# Patient Record
Sex: Male | Born: 1955 | Race: White | Hispanic: No | Marital: Married | Smoking: Former smoker
Health system: Southern US, Community
[De-identification: ages and names within clinical notes are randomized; demographics above are authoritative.]

## PROBLEM LIST (undated history)

## (undated) DIAGNOSIS — E119 Type 2 diabetes mellitus without complications: Secondary | ICD-10-CM

## (undated) DIAGNOSIS — I10 Essential (primary) hypertension: Secondary | ICD-10-CM

## (undated) DIAGNOSIS — I2510 Atherosclerotic heart disease of native coronary artery without angina pectoris: Secondary | ICD-10-CM

## (undated) DIAGNOSIS — Z87442 Personal history of urinary calculi: Secondary | ICD-10-CM

## (undated) DIAGNOSIS — N289 Disorder of kidney and ureter, unspecified: Secondary | ICD-10-CM

## (undated) DIAGNOSIS — M1990 Unspecified osteoarthritis, unspecified site: Secondary | ICD-10-CM

## (undated) HISTORY — PX: LITHOTRIPSY: SUR834

## (undated) HISTORY — PX: CORONARY ANGIOPLASTY WITH STENT PLACEMENT: SHX49

---

## 1999-03-02 ENCOUNTER — Encounter: Payer: Self-pay | Admitting: Urology

## 1999-03-02 ENCOUNTER — Inpatient Hospital Stay (HOSPITAL_COMMUNITY): Admission: RE | Admit: 1999-03-02 | Discharge: 1999-03-04 | Payer: Self-pay | Admitting: Urology

## 1999-03-03 ENCOUNTER — Encounter: Payer: Self-pay | Admitting: Urology

## 2003-07-06 ENCOUNTER — Encounter: Admission: RE | Admit: 2003-07-06 | Discharge: 2003-10-04 | Payer: Self-pay | Admitting: Family Medicine

## 2003-11-02 ENCOUNTER — Encounter: Admission: RE | Admit: 2003-11-02 | Discharge: 2004-01-31 | Payer: Self-pay | Admitting: Family Medicine

## 2008-08-22 DIAGNOSIS — I219 Acute myocardial infarction, unspecified: Secondary | ICD-10-CM

## 2008-08-22 HISTORY — DX: Acute myocardial infarction, unspecified: I21.9

## 2008-08-23 ENCOUNTER — Inpatient Hospital Stay (HOSPITAL_COMMUNITY): Admission: AD | Admit: 2008-08-23 | Discharge: 2008-08-27 | Payer: Self-pay | Admitting: Cardiology

## 2008-08-23 ENCOUNTER — Encounter: Payer: Self-pay | Admitting: Emergency Medicine

## 2008-08-23 ENCOUNTER — Ambulatory Visit: Payer: Self-pay | Admitting: Radiology

## 2008-08-23 ENCOUNTER — Ambulatory Visit: Payer: Self-pay | Admitting: Cardiology

## 2008-08-24 ENCOUNTER — Encounter: Payer: Self-pay | Admitting: Cardiology

## 2008-08-24 ENCOUNTER — Encounter: Admission: RE | Admit: 2008-08-24 | Discharge: 2008-08-26 | Payer: Self-pay | Admitting: Cardiology

## 2008-09-16 ENCOUNTER — Encounter (HOSPITAL_COMMUNITY): Admission: RE | Admit: 2008-09-16 | Discharge: 2008-12-15 | Payer: Self-pay | Admitting: Cardiology

## 2008-09-20 ENCOUNTER — Encounter: Payer: Self-pay | Admitting: Cardiology

## 2008-09-20 ENCOUNTER — Ambulatory Visit: Payer: Self-pay | Admitting: Cardiology

## 2008-10-19 ENCOUNTER — Telehealth: Payer: Self-pay | Admitting: Cardiology

## 2008-11-11 ENCOUNTER — Encounter: Payer: Self-pay | Admitting: Cardiology

## 2008-11-24 ENCOUNTER — Encounter (INDEPENDENT_AMBULATORY_CARE_PROVIDER_SITE_OTHER): Payer: Self-pay | Admitting: *Deleted

## 2008-12-17 ENCOUNTER — Ambulatory Visit: Payer: Self-pay | Admitting: Cardiology

## 2008-12-20 ENCOUNTER — Ambulatory Visit: Payer: Self-pay | Admitting: Cardiology

## 2009-01-11 LAB — CONVERTED CEMR LAB
ALT: 18 units/L (ref 0–53)
AST: 17 units/L (ref 0–37)
Albumin: 3.9 g/dL (ref 3.5–5.2)
Alkaline Phosphatase: 60 units/L (ref 39–117)
Bilirubin, Direct: 0.1 mg/dL (ref 0.0–0.3)
Cholesterol: 133 mg/dL (ref 0–200)
HDL: 38.7 mg/dL — ABNORMAL LOW (ref >39.00)
LDL Cholesterol: 67 mg/dL (ref 0–99)
Total Bilirubin: 0.7 mg/dL (ref 0.3–1.2)
Total CHOL/HDL Ratio: 3
Total Protein: 7 g/dL (ref 6.0–8.3)
Triglycerides: 138 mg/dL (ref 0.0–149.0)
VLDL: 27.6 mg/dL (ref 0.0–40.0)

## 2009-01-12 ENCOUNTER — Encounter: Payer: Self-pay | Admitting: Cardiology

## 2009-02-03 ENCOUNTER — Encounter: Payer: Self-pay | Admitting: Cardiology

## 2009-04-15 ENCOUNTER — Encounter: Payer: Self-pay | Admitting: Cardiology

## 2009-04-15 ENCOUNTER — Ambulatory Visit: Payer: Self-pay | Admitting: Cardiology

## 2009-04-18 ENCOUNTER — Encounter (INDEPENDENT_AMBULATORY_CARE_PROVIDER_SITE_OTHER): Payer: Self-pay | Admitting: *Deleted

## 2009-04-18 DIAGNOSIS — E119 Type 2 diabetes mellitus without complications: Secondary | ICD-10-CM | POA: Insufficient documentation

## 2009-04-18 DIAGNOSIS — R079 Chest pain, unspecified: Secondary | ICD-10-CM

## 2009-04-18 DIAGNOSIS — I10 Essential (primary) hypertension: Secondary | ICD-10-CM | POA: Insufficient documentation

## 2009-04-18 DIAGNOSIS — E785 Hyperlipidemia, unspecified: Secondary | ICD-10-CM | POA: Insufficient documentation

## 2009-04-19 ENCOUNTER — Ambulatory Visit: Payer: Self-pay | Admitting: Cardiology

## 2009-06-10 ENCOUNTER — Encounter (INDEPENDENT_AMBULATORY_CARE_PROVIDER_SITE_OTHER): Payer: Self-pay | Admitting: *Deleted

## 2009-08-02 DIAGNOSIS — I214 Non-ST elevation (NSTEMI) myocardial infarction: Secondary | ICD-10-CM | POA: Insufficient documentation

## 2009-08-02 DIAGNOSIS — I2510 Atherosclerotic heart disease of native coronary artery without angina pectoris: Secondary | ICD-10-CM | POA: Insufficient documentation

## 2009-08-16 ENCOUNTER — Ambulatory Visit: Payer: Self-pay | Admitting: Cardiology

## 2009-08-25 ENCOUNTER — Telehealth (INDEPENDENT_AMBULATORY_CARE_PROVIDER_SITE_OTHER): Payer: Self-pay | Admitting: *Deleted

## 2009-08-29 ENCOUNTER — Ambulatory Visit: Payer: Self-pay | Admitting: Cardiovascular Disease

## 2009-08-29 ENCOUNTER — Ambulatory Visit: Payer: Self-pay

## 2009-08-29 ENCOUNTER — Encounter (HOSPITAL_COMMUNITY): Admission: RE | Admit: 2009-08-29 | Discharge: 2009-11-08 | Payer: Self-pay | Admitting: Cardiology

## 2009-10-04 ENCOUNTER — Encounter (INDEPENDENT_AMBULATORY_CARE_PROVIDER_SITE_OTHER): Payer: Self-pay | Admitting: *Deleted

## 2010-01-24 ENCOUNTER — Ambulatory Visit: Payer: Self-pay | Admitting: Internal Medicine

## 2010-01-25 ENCOUNTER — Observation Stay (HOSPITAL_COMMUNITY): Admission: EM | Admit: 2010-01-25 | Discharge: 2010-01-26 | Payer: Self-pay | Admitting: Emergency Medicine

## 2010-02-16 ENCOUNTER — Encounter: Payer: Self-pay | Admitting: Physician Assistant

## 2010-02-21 ENCOUNTER — Telehealth (INDEPENDENT_AMBULATORY_CARE_PROVIDER_SITE_OTHER): Payer: Self-pay | Admitting: *Deleted

## 2010-02-21 ENCOUNTER — Ambulatory Visit: Payer: Self-pay | Admitting: Cardiology

## 2010-02-21 ENCOUNTER — Encounter: Payer: Self-pay | Admitting: Physician Assistant

## 2010-02-21 DIAGNOSIS — F411 Generalized anxiety disorder: Secondary | ICD-10-CM | POA: Insufficient documentation

## 2010-03-30 ENCOUNTER — Telehealth (INDEPENDENT_AMBULATORY_CARE_PROVIDER_SITE_OTHER): Payer: Self-pay | Admitting: *Deleted

## 2010-04-19 ENCOUNTER — Ambulatory Visit: Payer: Self-pay | Admitting: Cardiology

## 2010-05-31 ENCOUNTER — Ambulatory Visit: Payer: Self-pay | Admitting: Internal Medicine

## 2010-08-02 ENCOUNTER — Telehealth: Payer: Self-pay | Admitting: Cardiology

## 2010-08-08 ENCOUNTER — Other Ambulatory Visit: Payer: Self-pay | Admitting: Cardiology

## 2010-08-08 ENCOUNTER — Ambulatory Visit
Admission: RE | Admit: 2010-08-08 | Discharge: 2010-08-08 | Payer: Self-pay | Source: Home / Self Care | Attending: Cardiology | Admitting: Cardiology

## 2010-08-08 LAB — LIPID PANEL
HDL: 36.1 mg/dL — ABNORMAL LOW (ref >39.00)
LDL Cholesterol: 90 mg/dL (ref 0–99)
Total CHOL/HDL Ratio: 4
Triglycerides: 158 mg/dL — ABNORMAL HIGH (ref 0.0–149.0)

## 2010-08-08 LAB — HEPATIC FUNCTION PANEL
Albumin: 3.9 g/dL (ref 3.5–5.2)
Total Protein: 6.9 g/dL (ref 6.0–8.3)

## 2010-08-10 NOTE — Assessment & Plan Note (Signed)
Summary: Cardiology Nuclear Study  Nuclear Med Background Indications for Stress Test: Evaluation for Ischemia, Stent Patency   History: Heart Catheterization, Myocardial Infarction, Stents  History Comments: 2/10 MI: NSTEMI > Heart Cath: EF=65% > Stent: RCA  Symptoms: Chest Pain with Exertion, SOB    Nuclear Pre-Procedure Cardiac Risk Factors: Hypertension, Lipids, NIDDM Caffeine/Decaff Intake: None NPO After: 6:00 AM Lungs: clear IV 0.9% NS with Angio Cath: 22g     IV Site: (R) AC IV Started by: Irean Hong RN Chest Size (in) 46     Height (in): 69 Weight (lb): 225 BMI: 33.35 Tech Comments: The patient took the lopressor 12 hrs ago, baseline HR 59-60.The patient has a problem with (L) knee pain and is wearing a brace today.The patient prefers to do the test with medication today rather than rescheduling the ETT myoview. P. Edwards,RN.  Nuclear Med Study 1 or 2 day study:  1 day     Stress Test Type:  Eugenie Birks Reading MD:  Charlton Haws, MD     Referring MD:  T.Wall Resting Radionuclide:  Technetium 11m Tetrofosmin     Resting Radionuclide Dose:  11.0 mCi  Stress Radionuclide:  Technetium 30m Tetrofosmin     Stress Radionuclide Dose:  33.0 mCi   Stress Protocol   Lexiscan: 0.4 mg   Stress Test Technologist:  Milana Na EMT-P     Nuclear Technologist:  Burna Mortimer Deal RT-N  Rest Procedure  Myocardial perfusion imaging was performed at rest 45 minutes following the intravenous administration of Myoview Technetium 35m Tetrofosmin.  Stress Procedure  The patient received IV Lexiscan 0.4 mg over 15-seconds.  Myoview injected at 30-seconds.  There were no significant changes with infusion.  Quantitative spect images were obtained after a 45 minute delay.  QPS Raw Data Images:  Normal; no motion artifact; normal heart/lung ratio. Stress Images:  NI: Uniform and normal uptake of tracer in all myocardial segments. Rest Images:  Normal homogeneous uptake in all areas of the  myocardium. Subtraction (SDS):  SDS 9 abnormal in mid and basal septum but not seen visually Transient Ischemic Dilatation:  1.11  (Normal <1.22)  Lung/Heart Ratio:  .28  (Normal <0.45)  Quantitative Gated Spect Images QGS EDV:  119 ml QGS ESV:  51 ml QGS EF:  57 % QGS cine images:  normal  Findings Normal nuclear study      Overall Impression  Exercise Capacity: Lexiscan BP Response: Normal blood pressure response. Clinical Symptoms: Abdominal Cramping ECG Impression: No significant ST segment change suggestive of ischemia. Overall Impression: Normal stress nuclear study. Overall Impression Comments: Mild attenuation mid and basal inferior wall consistant with diaphragm  Appended Document: Cardiology Nuclear Study good. no change in treatment.  Appended Document: Cardiology Nuclear Study LMTCB./CY  Appended Document: Cardiology Nuclear Study pt aware ./cy

## 2010-08-10 NOTE — Progress Notes (Signed)
  [employer] request recieved sent to [facility]  February 21, 2010 8:27 AM     Appended Document:  [employer]UM request recieved sent to [facility]

## 2010-08-10 NOTE — Assessment & Plan Note (Signed)
Summary: 4 MONTH ROV/SL   Visit Type:  4 mo f/u Primary Provider:  Donovan Kail  CC:  no cardiac complaints today.  History of Present Illness: Ryan Stuart returns today for evaluation managed on artery disease, mixed hyperlipidemia, hypertension. He remains in the tracer study.  He's had some chest discomfort well localized at the left breast. 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 He's had no other exertional chest pain.  He's had a drug-eluting stent placed to his right coronary artery and overlapping drug-eluting stents to his LAD. He has normal left ventricular function. He presented with exertional angina and ruled in for a non-ST segment elevation MI. He is almost a year out from the event as of February 17.  He is not smoking. He is very compliant with his medications. He would like to come off some of his medications however.  Current Medications (verified): 1)  Metformin Hcl 500 Mg Tabs (Metformin Hcl) .Marland Kitchen.. 1 Tab Two Times A Day 2)  Aspirin Ec 325 Mg Tbec (Aspirin) .... Take One Tablet By Mouth Daily 3)  Fish Oil 1000 Mg Caps (Omega-3 Fatty Acids) .... 2 Caps Two Times A Day 4)  Plavix 75 Mg Tabs (Clopidogrel Bisulfate) .Marland Kitchen.. 1 Tab Once Daily 5)  Amlodipine Besy-Benazepril Hcl 5-20 Mg Caps (Amlodipine Besy-Benazepril Hcl) .Marland Kitchen.. 1 Tab Once Daily 6)  Lopressor 50 Mg Tabs (Metoprolol Tartrate) .Marland Kitchen.. 1 Tab Two Times A Day 7)  Simvastatin 40 Mg Tabs (Simvastatin) .Marland Kitchen.. 1 Tab At Bedtime 8)  Isosorbide Mononitrate Cr 30 Mg Xr24h-Tab (Isosorbide Mononitrate) .Marland Kitchen.. 1 Tab Once Daily 9)  Nitroglycerin 0.4 Mg Subl (Nitroglycerin) .... One Tablet Under Tongue Every 5 Minutes As Needed For Chest Pain---May Repeat Times Three 10)  Trace Study Drug .... Take As Directed  Allergies (verified): No Known Drug Allergies  Past History:  Past Medical History: Last updated: 08/02/2009 MYOCARDIAL INFARCTION, ACUTE, SUBENDOCARDIAL (ICD-410.70) CAD, NATIVE VESSEL (ICD-414.01) CHEST PAIN,  EXERTIONAL (ICD-786.50) HYPERTENSION (ICD-401.9) HYPERLIPIDEMIA (ICD-272.4) DIABETES MELLITUS, TYPE II (ICD-250.00)  Past Surgical History: Last updated: 09/20/2008 Cath 08/25/08 Cath 08/24/08  Family History: Last updated: 09/20/2008 non contributory  Social History: Last updated: 09/20/2008 Full Time Married  Tobacco Use - Yes.   Risk Factors: Smoking Status: current (09/20/2008)  Review of Systems       negative other than history of present illness  Vital Signs:  Patient profile:   55 year old male Height:      69 inches Weight:      230 pounds BMI:     34.09 Pulse rate:   63 / minute Pulse rhythm:   regular BP sitting:   128 / 78  (left arm) Cuff size:   large  Vitals Entered By: Danielle Rankin, CMA (August 16, 2009 2:57 PM)  Physical Exam  General:  muscular, overweight Head:  normocephalic and atraumatic Eyes:  PERRLA/EOM intact; conjunctiva and lids normal. Mouth:  Teeth, gums and palate normal. Oral mucosa normal. Neck:  Neck supple, no JVD. No masses, thyromegaly or abnormal cervical nodes. Chest Aarik Blank:  no deformities or breast masses noted Lungs:  Clear bilaterally to auscultation and percussion. Heart:  Non-displaced PMI, chest non-tender; regular rate and rhythm, S1, S2 without murmurs, rubs or gallops. Carotid upstroke normal, no bruit. Normal abdominal aortic size, no bruits. Femorals normal pulses, no bruits. Pedals normal pulses. No edema, no varicosities. Abdomen:  Bowel sounds positive; abdomen soft and non-tender without masses, organomegaly, or hernias noted. No hepatosplenomegaly. Msk:  Back  normal, normal gait. Muscle strength and tone normal. Pulses:  pulses normal in all 4 extremities Extremities:  No clubbing or cyanosis. Neurologic:  Alert and oriented x 3. Skin:  Intact without lesions or rashes.   EKG  Procedure date:  08/16/2009  Findings:      normal sinus rhythm, normal EKG  Impression & Recommendations:  Problem # 1:   CAD, NATIVE VESSEL (ICD-414.01) Assessment Unchanged his discomfort may be ischemic. We will perform an exercise rest is my view. If this is negative for ischemia, we'll see him back again in 6 months. On the 17th of this month, he can stop his Plavix. Have also stopped his isosorbide. His updated medication list for this problem includes:    Aspirin Ec 325 Mg Tbec (Aspirin) .Marland Kitchen... Take one tablet by mouth daily    Plavix 75 Mg Tabs (Clopidogrel bisulfate) .Marland Kitchen... 1 tab once daily    Amlodipine Besy-benazepril Hcl 5-20 Mg Caps (Amlodipine besy-benazepril hcl) .Marland Kitchen... 1 tab once daily    Lopressor 50 Mg Tabs (Metoprolol tartrate) .Marland Kitchen... 1 tab two times a day    Isosorbide Mononitrate Cr 30 Mg Xr24h-tab (Isosorbide mononitrate) .Marland Kitchen... 1 tab once daily    Nitroglycerin 0.4 Mg Subl (Nitroglycerin) ..... One tablet under tongue every 5 minutes as needed for chest pain---may repeat times three  Orders: EKG w/ Interpretation (93000)  Problem # 2:  CHEST PAIN, EXERTIONAL (ICD-786.50) Assessment: New  His updated medication list for this problem includes:    Aspirin Ec 325 Mg Tbec (Aspirin) .Marland Kitchen... Take one tablet by mouth daily    Plavix 75 Mg Tabs (Clopidogrel bisulfate) .Marland Kitchen... 1 tab once daily    Amlodipine Besy-benazepril Hcl 5-20 Mg Caps (Amlodipine besy-benazepril hcl) .Marland Kitchen... 1 tab once daily    Lopressor 50 Mg Tabs (Metoprolol tartrate) .Marland Kitchen... 1 tab two times a day    Isosorbide Mononitrate Cr 30 Mg Xr24h-tab (Isosorbide mononitrate) .Marland Kitchen... 1 tab once daily    Nitroglycerin 0.4 Mg Subl (Nitroglycerin) ..... One tablet under tongue every 5 minutes as needed for chest pain---may repeat times three  Orders: EKG w/ Interpretation (93000) Nuclear Stress Test (Nuc Stress Test)  Problem # 3:  HYPERLIPIDEMIA (ICD-272.4) Assessment: Improved  His updated medication list for this problem includes:    Simvastatin 40 Mg Tabs (Simvastatin) .Marland Kitchen... 1 tab at bedtime  His updated medication list for this problem  includes:    Simvastatin 40 Mg Tabs (Simvastatin) .Marland Kitchen... 1 tab at bedtime  Patient Instructions: 1)  Your physician recommends that you schedule a follow-up appointment in: 6 MONTHS WITH DR Angelene Rome 2)  Your physician has recommended you make the following change in your medication: STOP ISOSORBIDE  MAY STOP PLAVIX AFTER FEB 17TH 3)  Your physician has requested that you have an exercise stress myoview.  For further information please visit [email or web address]  Please follow instruction sheet, as given.

## 2010-08-10 NOTE — Progress Notes (Addendum)
  Phone Note Outgoing Call   Call placed by: Lisabeth Devoid RN,  August 02, 2010 4:33 PM Call placed to: Patient Summary of Call: labs  Follow-up for Phone Call        I spoke with pt about follow-up lab work Simvastatin dose 04/19/10.  Pt will come in 08/08/10 for fasting lipid and liver. Follow-up by: Lisabeth Devoid RN,  August 02, 2010 4:35 PM

## 2010-08-10 NOTE — Assessment & Plan Note (Signed)
Summary: PER CHECK OUT/SF   Visit Type:  2 mo f/u Primary Provider:  Donovan Kail  CC:  pt states he just went back to work 03/31/10.Marland KitchenMarland KitchenBP high on pt's home BP machine....sob at times......denies any cp or edema.  History of Present Illness: Mr. Bekker returns for chest tightness and pressure. He has coronary disease but had a catheterization in July which showed nonobstructive disease.  He attributes it now to stress at work. he has had no further chest tightness.  His blood pressure was high and he is now on 10 mg of amlodipine. His blood pressure is much improved he has had no swelling. He is on simvastatin 40 and will have to make adjustments per FDA guidelines.  Current Medications (verified): 1)  Metformin Hcl 500 Mg Tabs (Metformin Hcl) .Marland Kitchen.. 1 Tab Two Times A Day 2)  Aspirin Ec 325 Mg Tbec (Aspirin) .... Take One Tablet By Mouth Daily 3)  Fish Oil 1000 Mg Caps (Omega-3 Fatty Acids) .... 2 Caps Two Times A Day 4)  Simvastatin 40 Mg Tabs (Simvastatin) .Marland Kitchen.. 1 Tab At Bedtime 5)  Isosorbide Mononitrate Cr 30 Mg Xr24h-Tab (Isosorbide Mononitrate) .Marland Kitchen.. 1 Tab Once Daily 6)  Nitroglycerin 0.4 Mg Subl (Nitroglycerin) .... One Tablet Under Tongue Every 5 Minutes As Needed For Chest Pain---May Repeat Times Three 7)  Benazepril Hcl 40 Mg Tabs (Benazepril Hcl) .... Take 1 Tablet By Mouth Once A Day 8)  Bystolic 10 Mg Tabs (Nebivolol Hcl) .Marland Kitchen.. 1 Tab Once Daily 9)  Budeprion Xl 300 Mg Xr24h-Tab (Bupropion Hcl) .Marland Kitchen.. 1 Tab Once Daily 10)  Azor 10-40 Mg Tabs (Amlodipine-Olmesartan) .Marland Kitchen.. 1 Tab Once Daily 11)  Alprazolam 0.25 Mg Tabs (Alprazolam) .Marland Kitchen.. 1 Tab Two Times A Day  Allergies: No Known Drug Allergies  Past History:  Past Surgical History: Last updated: 09/20/2008 Cath 08/25/08 Cath 08/24/08  Review of Systems       negative other than history of present illness  Vital Signs:  Patient profile:   55 year old male Height:      69 inches Weight:      230 pounds BMI:     34.09 Pulse  rate:   72 / minute Pulse rhythm:   regular BP sitting:   118 / 70  (left arm) Cuff size:   large  Vitals Entered By: Danielle Rankin, CMA (April 19, 2010 9:58 AM)  Physical Exam  General:  obese.   Head:  normocephalic and atraumatic Eyes:  PERRLA/EOM intact; conjunctiva and lids normal. Neck:  Neck supple, no JVD. No masses, thyromegaly or abnormal cervical nodes. Chest Oracio Galen:  no deformities or breast masses noted Lungs:  Clear bilaterally to auscultation and percussion. Heart:  PMI not displaced, normal S1-S2, no murmur rub or gallop. Carotids are full without bruits Msk:  Back normal, normal gait. Muscle strength and tone normal. Pulses:  pulses normal in all 4 extremities Extremities:  No clubbing or cyanosis. Neurologic:  Alert and oriented x 3. Skin:  Intact without lesions or rashes. Psych:  Normal affect.   Impression & Recommendations:  Problem # 1:  CAD, NATIVE VESSEL (ICD-414.01)  The following medications were removed from the medication list:    Amlodipine Besylate 10 Mg Tabs (Amlodipine besylate) .Marland Kitchen... Take one tablet by mouth daily His updated medication list for this problem includes:    Aspirin Ec 325 Mg Tbec (Aspirin) .Marland Kitchen... Take one tablet by mouth daily    Isosorbide Mononitrate Cr 30 Mg Xr24h-tab (Isosorbide mononitrate) .Marland Kitchen... 1 tab once  daily    Nitroglycerin 0.4 Mg Subl (Nitroglycerin) ..... One tablet under tongue every 5 minutes as needed for chest pain---may repeat times three    Benazepril Hcl 40 Mg Tabs (Benazepril hcl) .Marland Kitchen... Take 1 tablet by mouth once a day    Bystolic 10 Mg Tabs (Nebivolol hcl) .Marland Kitchen... 1 tab once daily    Azor 10-40 Mg Tabs (Amlodipine-olmesartan) .Marland Kitchen... 1 tab once daily  Problem # 2:  MYOCARDIAL INFARCTION, ACUTE, SUBENDOCARDIAL (ICD-410.70)  The following medications were removed from the medication list:    Amlodipine Besylate 10 Mg Tabs (Amlodipine besylate) .Marland Kitchen... Take one tablet by mouth daily His updated medication list for  this problem includes:    Aspirin Ec 325 Mg Tbec (Aspirin) .Marland Kitchen... Take one tablet by mouth daily    Isosorbide Mononitrate Cr 30 Mg Xr24h-tab (Isosorbide mononitrate) .Marland Kitchen... 1 tab once daily    Nitroglycerin 0.4 Mg Subl (Nitroglycerin) ..... One tablet under tongue every 5 minutes as needed for chest pain---may repeat times three    Benazepril Hcl 40 Mg Tabs (Benazepril hcl) .Marland Kitchen... Take 1 tablet by mouth once a day    Bystolic 10 Mg Tabs (Nebivolol hcl) .Marland Kitchen... 1 tab once daily    Azor 10-40 Mg Tabs (Amlodipine-olmesartan) .Marland Kitchen... 1 tab once daily  Problem # 3:  CHEST PAIN, EXERTIONAL (ICD-786.50) Assessment: Improved  The following medications were removed from the medication list:    Amlodipine Besylate 10 Mg Tabs (Amlodipine besylate) .Marland Kitchen... Take one tablet by mouth daily His updated medication list for this problem includes:    Aspirin Ec 325 Mg Tbec (Aspirin) .Marland Kitchen... Take one tablet by mouth daily    Isosorbide Mononitrate Cr 30 Mg Xr24h-tab (Isosorbide mononitrate) .Marland Kitchen... 1 tab once daily    Nitroglycerin 0.4 Mg Subl (Nitroglycerin) ..... One tablet under tongue every 5 minutes as needed for chest pain---may repeat times three    Benazepril Hcl 40 Mg Tabs (Benazepril hcl) .Marland Kitchen... Take 1 tablet by mouth once a day    Bystolic 10 Mg Tabs (Nebivolol hcl) .Marland Kitchen... 1 tab once daily    Azor 10-40 Mg Tabs (Amlodipine-olmesartan) .Marland Kitchen... 1 tab once daily  Problem # 4:  HYPERLIPIDEMIA (ICD-272.4) Will decrease to 20 mg q.h.s. Followup blood work that Dr. Tenny Craw. If LDL greater than 70, I would recommend switching to Lipitor 20 mg since it would be generic later this year His updated medication list for this problem includes:    Simvastatin 20 Mg Tabs (Simvastatin) .Marland Kitchen... Take 1 tablet at bedtime  Clinical Reports Reviewed:  CXR:  01/25/2010: CXR Results:   Clinical Data: Left-sided chest pain.  Previous myocardial   infarction.    CHEST - 2 VIEW    Comparison: 08/23/2008    Findings:  The heart size  and mediastinal contours are within   normal limits.  Both lungs are clear.  The visualized skeletal   structures are unremarkable.    IMPRESSION:   No active cardiopulmonary disease.    Read By:  Danae Orleans,  M.D.   Released By:  Danae Orleans,  M.D.  Cardiac Cath:  01/26/2010: Cardiac Cath Findings:   Left ventriculogram:  The left ventriculogram performed in the RAO   projection showed good Lucion Dilger motion with no areas of hypokinesis.   Estimated ejection fraction was 60%.      The aortic pressure was 175/94 with mean of 124.  Left ventricular   pressure was 175/30.      CONCLUSION:  Coronary artery disease status post  prior percutaneous   coronary intervention procedures as described above with 0% stenosis in   the overlapping stent in the proximal to midLAD, 40% narrowing in the   marginal branch of the circumflex artery, 0% stenosis at the stent site   in the posterior descending branch of the right coronary, and normal LV   function.      RECOMMENDATIONS:  The patient has only nonobstructive disease and there   does not appear to be any source of ischemia.  Etiology of the patient's   symptoms are not clear.  Blood pressure is a little labile, and we will   plan adjustments as needed to control his blood pressure, and we will   arrange discharge tomorrow and follow up with Dr. Daleen Squibb.               Bruce Elvera Lennox Juanda Chance, MD, [facility]      01/25/2010: Cardiac Cath Findings:  Coronary artery disease status post prior percutaneous coronary intervention procedures as described above with 0% stenosis in the overlapping stent in the proximal to midLAD, 40% narrowing in the marginal branch of the circumflex artery, 0% stenosis at the stent site in the posterior descending branch of the right coronary, and normal LV function.   RECOMMENDATIONS:  The patient has only nonobstructive disease and there does not appear to be any source of ischemia.  Etiology of the patient's symptoms  are not clear.  Blood pressure is a little labile, and we will plan adjustments as needed to control his blood pressure, and we will arrange discharge tomorrow and follow up with Dr. Daleen Squibb.    08/25/2008: Cardiac Cath Findings:  ASSESSMENT:  Successful two-vessel percutaneous coronary intervention as   detailed above with overlapping drug-eluting stents in the left anterior   descending and a single drug-eluting stent in the right posterior   descending artery.      PLAN:  Recommend a minimum of 12 months of antiplatelet therapy with   aspirin and Plavix.               Veverly Fells. Excell Seltzer, MD   08/24/2008: Cardiac Cath Findings:  ASSESSMENT AND PLAN:  This is a 55 year old with a history of type 2   diabetes and hypertension who presented with a non-ST-elevation   myocardial infarction.  The patient was found to have a long 80-90%   proximal to mid LAD stenosis.  This lesion involved the ostium of a   moderate sized second diagonal with a 90% stenosis.  Additionally, there   is a long 90% stenosis in the proximal to mid PDA and 99% stenosis of   the superior branch of the small PLV.  The patient does have 2   significant and relatively complex coronary lesions that should both be   revascularized.  I discussed the case with Dr. Clifton James.  Both coronary   artery bypass grafting and PCI would be feasible, the PCI would involve   intervening on 2 relatively complex lesions.  We will plan on discussing   the case   with the other interventional staff in the morning with possible staged   PCI to the LAD tomorrow then to the PDA at a later time versus   evaluation by Cardiac Surgery.  This approach would not delay surgery   because the patient did receive Plavix yesterday and would have to wait   a few days before bypass surgery anyway.  Marca Ancona, MD   Electronically Signed   Nuclear Study:  08/29/2009:  Excerise capacity: Eugenie Birks  Blood Pressure response:  Normal blood pressure response  Clinical symptoms: Abdominal cramping  ECG impression: No significant ST segment change suggestive of ischemia  Overall impression: Normal stress nuclear study.  Overall Impression Comments: Mild attenuation mid and basal inferior Norbert Malkin consistent with diaphragm.  Charlton Haws, MD, [facility]   08/29/2009:  Exercise Capacity: Lexiscan BP Response: Normal blood pressure response. Clinical Symptoms: Abdominal Cramping ECG Impression: No significant ST segment change suggestive of ischemia. Overall Impression: Normal stress nuclear study. Overall Impression Comments: Mild attenuation mid and basal inferior Azhar Knope consistant with diaphragm   Patient Instructions: 1)  Your physician recommends that you schedule a follow-up appointment in: 1 year with Dr. Daleen Squibb 2)  Your physician recommends that you return for a FASTING lipid profile and liver in 6-8 weeks.  Please call for a lab appt. 3)  Your physician has recommended you make the following change in your medication:  Prescriptions: SIMVASTATIN 20 MG TABS (SIMVASTATIN) take 1 tablet at bedtime  #30 x 11   Entered by:   Lisabeth Devoid RN   Authorized by:   Gaylord Shih, MD, [facility]   Signed by:   Lisabeth Devoid RN on 04/19/2010   Method used:   Electronically to        [facility]  [address] [id number] (retail)       [address]       [place], [place]  [address]       Ph: [phone] or [phone]       Fax: [phone]   RxID:   [id number]

## 2010-08-10 NOTE — Progress Notes (Signed)
  DDS request recieved sent to [facility]  March 30, 2010 11:34 AM

## 2010-08-10 NOTE — Assessment & Plan Note (Signed)
Summary: eph/jm   Visit Type:  Post-hospital Primary Provider:  Donovan Kail  CC:  No complains.  History of Present Illness: This is a 55 year old white male patient who was admitted to the hospital with noncardiac chest pain and underwent cardiac catheterization January 25, 2010 which revealed patent stent in the proximal to mid LAD, 40% marginal branch of the circumflex, patent stent in the PDA, and normal LV function.   The patient is under treatment with some stress and feels this is his main problem. He is trying to get an short-term disability and is being seen by a psychiatrist. He denies any further chest pain palpitations dyspnea dyspnea on exertion dizziness or presyncope.  Current Medications (verified): 1)  Metformin Hcl 500 Mg Tabs (Metformin Hcl) .Marland Kitchen.. 1 Tab Two Times A Day 2)  Aspirin Ec 325 Mg Tbec (Aspirin) .... Take One Tablet By Mouth Daily 3)  Fish Oil 1000 Mg Caps (Omega-3 Fatty Acids) .... 2 Caps Two Times A Day 4)  Amlodipine Besylate 10 Mg Tabs (Amlodipine Besylate) .... Take One Tablet By Mouth Daily 5)  Simvastatin 40 Mg Tabs (Simvastatin) .Marland Kitchen.. 1 Tab At Bedtime 6)  Isosorbide Mononitrate Cr 30 Mg Xr24h-Tab (Isosorbide Mononitrate) .Marland Kitchen.. 1 Tab Once Daily 7)  Nitroglycerin 0.4 Mg Subl (Nitroglycerin) .... One Tablet Under Tongue Every 5 Minutes As Needed For Chest Pain---May Repeat Times Three 8)  Benazepril Hcl 40 Mg Tabs (Benazepril Hcl) .... Take 1 Tablet By Mouth Once A Day 9)  Bystolic 5 Mg Tabs (Nebivolol Hcl) .... Take 1 Tablet By Mouth Once A Day Pt. Ran Out 2 Days Ago 10)  Wellbutrin Xl 150 Mg Xr24h-Tab (Bupropion Hcl) .... Take 1 Tablet By Mouth Once A Day 11)  Clonazepam 0.5 Mg Tabs (Clonazepam) .... Take 1 Tablet By Mouth Two Times A Day  Allergies (verified): No Known Drug Allergies  Past History:  Past Medical History: Last updated: 08/02/2009 MYOCARDIAL INFARCTION, ACUTE, SUBENDOCARDIAL (ICD-410.70) CAD, NATIVE VESSEL (ICD-414.01) CHEST PAIN,  EXERTIONAL (ICD-786.50) HYPERTENSION (ICD-401.9) HYPERLIPIDEMIA (ICD-272.4) DIABETES MELLITUS, TYPE II (ICD-250.00)  Review of Systems       see history of present illness  Vital Signs:  Patient profile:   55 year old male Height:      69 inches Weight:      226.50 pounds BMI:     33.57 Pulse rate:   78 / minute Pulse rhythm:   regular Resp:     18 per minute BP sitting:   142 / 80  (left arm) Cuff size:   large  Vitals Entered By: Vikki Ports (February 21, 2010 11:07 AM)  Serial Vital Signs/Assessments:  Time      Position  BP       Pulse  Resp  Temp     By                     128/80                         Marletta Lor, PA-C   Physical Exam  General:   Well-nournished, in no acute distress. Neck: No JVD, HJR, Bruit, or thyroid enlargement Lungs: No tachypnea, clear without wheezing, rales, or rhonchi Cardiovascular: RRR, PMI not displaced, heart sounds normal, no murmurs, gallops, bruit, thrill, or heave. Abdomen: BS normal. Soft without organomegaly, masses, lesions or tenderness. Extremities: right radial artery without hematoma or hemorrhage good brachial pulse, lower extremities without cyanosis, clubbing or edema. Good  distal pulses bilateral SKin: Warm, no lesions or rashes  Musculoskeletal: No deformities Neuro: no focal signs    EKG  Procedure date:  02/21/2010  Findings:      normal sinus rhythm normal EKG  Impression & Recommendations:  Problem # 1:  CAD, NATIVE VESSEL (ICD-414.01) Patient was admitted to the hospital with noncardiac chest pain cardiac catheter revealed the stents were patent and he had normal LV function. He had prior stenting to the mid LAD and distal RCA in February 2010. The following medications were removed from the medication list:    Plavix 75 Mg Tabs (Clopidogrel bisulfate) .Marland Kitchen... 1 tab once daily    Lopressor 50 Mg Tabs (Metoprolol tartrate) .Marland Kitchen... 1 tab two times a day His updated medication list for this problem  includes:    Aspirin Ec 325 Mg Tbec (Aspirin) .Marland Kitchen... Take one tablet by mouth daily    Amlodipine Besylate 10 Mg Tabs (Amlodipine besylate) .Marland Kitchen... Take one tablet by mouth daily    Isosorbide Mononitrate Cr 30 Mg Xr24h-tab (Isosorbide mononitrate) .Marland Kitchen... 1 tab once daily    Nitroglycerin 0.4 Mg Subl (Nitroglycerin) ..... One tablet under tongue every 5 minutes as needed for chest pain---may repeat times three    Benazepril Hcl 40 Mg Tabs (Benazepril hcl) .Marland Kitchen... Take 1 tablet by mouth once a day    Bystolic 5 Mg Tabs (Nebivolol hcl) .Marland Kitchen... Take 1 tablet by mouth once a day pt. ran out 2 days ago  Problem # 2:  ANXIETY STATE, UNSPECIFIED (ICD-300.00) Patient's anxiety is causing a large part of his problem. He is seeing a psychiatrist and doing better on his current medications. Most of the stresses related to work and he is seeking short-term disability.  Problem # 3:  HYPERTENSION (ICD-401.9) Blood pressure was elevated initially when came into the office and when I retook it it came down. I asked him to follow 2 g sodium diet, check his blood pressures at home and call us if they are elevated. The following medications were removed from the medication list:    Lopressor 50 Mg Tabs (Metoprolol tartrate) .Marland Kitchen... 1 tab two times a day His updated medication list for this problem includes:    Aspirin Ec 325 Mg Tbec (Aspirin) .Marland Kitchen... Take one tablet by mouth daily    Amlodipine Besylate 10 Mg Tabs (Amlodipine besylate) .Marland Kitchen... Take one tablet by mouth daily    Benazepril Hcl 40 Mg Tabs (Benazepril hcl) .Marland Kitchen... Take 1 tablet by mouth once a day    Bystolic 5 Mg Tabs (Nebivolol hcl) .Marland Kitchen... Take 1 tablet by mouth once a day pt. ran out 2 days ago  Other Orders: EKG w/ Interpretation (93000)  Patient Instructions: 1)  Your physician recommends that you schedule a follow-up appointment in: 2 months with Dr. Daleen Squibb. 2)  Your physician has requested that you limit the intake of sodium (salt) in your diet to two grams  daily. Please see [facility] handout.

## 2010-08-10 NOTE — Progress Notes (Signed)
Summary: Nuclear Pre-Procedure  Phone Note Outgoing Call Call back at Home Phone [phone]   Call placed by: Stanton Kidney, EMT-P,  August 25, 2009 1:31 PM Action Taken: Phone Call Completed Summary of Call: Left message with information on [employer] Information Sheet (see scanned document for details).     Nuclear Med Background Indications for Stress Test: Evaluation for Ischemia, Stent Patency   History: Heart Catheterization, Myocardial Infarction, Stents  History Comments: 2/10 MI: NSTEMI > Heart Cath: EF=65% > Stent: RCA  Symptoms: Chest Pain with Exertion    Nuclear Pre-Procedure Cardiac Risk Factors: Hypertension, Lipids, NIDDM Height (in): 69

## 2010-08-10 NOTE — Miscellaneous (Signed)
  Clinical Lists Changes  Observations: Added new observation of RS STUDY: TRACER - study completion 08/16/09 (10/04/2009 12:10)      Research Study Name: TRACER - study completion 08/16/09

## 2010-08-10 NOTE — Miscellaneous (Signed)
  Clinical Lists Changes  Observations: Added new observation of CARDCATHFIND: Coronary artery disease status post prior percutaneous coronary intervention procedures as described above with 0% stenosis in the overlapping stent in the proximal to midLAD, 40% narrowing in the marginal branch of the circumflex artery, 0% stenosis at the stent site in the posterior descending branch of the right coronary, and normal LV function.   RECOMMENDATIONS:  The patient has only nonobstructive disease and there does not appear to be any source of ischemia.  Etiology of the patient's symptoms are not clear.  Blood pressure is a little labile, and we will plan adjustments as needed to control his blood pressure, and we will arrange discharge tomorrow and follow up with Dr. Daleen Squibb.   (01/25/2010 11:26) Added new observation of NUCLEAR NOS: Exercise Capacity: Lexiscan BP Response: Normal blood pressure response. Clinical Symptoms: Abdominal Cramping ECG Impression: No significant ST segment change suggestive of ischemia. Overall Impression: Normal stress nuclear study. Overall Impression Comments: Mild attenuation mid and basal inferior wall consistant with diaphragm   (08/29/2009 11:27)      Cardiac Cath  Procedure date:  01/25/2010  Findings:      Coronary artery disease status post prior percutaneous coronary intervention procedures as described above with 0% stenosis in the overlapping stent in the proximal to midLAD, 40% narrowing in the marginal branch of the circumflex artery, 0% stenosis at the stent site in the posterior descending branch of the right coronary, and normal LV function.   RECOMMENDATIONS:  The patient has only nonobstructive disease and there does not appear to be any source of ischemia.  Etiology of the patient's symptoms are not clear.  Blood pressure is a little labile, and we will plan adjustments as needed to control his blood pressure, and we will arrange  discharge tomorrow and follow up with Dr. Daleen Squibb.    Nuclear Study  Procedure date:  08/29/2009  Findings:      Exercise Capacity: Lexiscan BP Response: Normal blood pressure response. Clinical Symptoms: Abdominal Cramping ECG Impression: No significant ST segment change suggestive of ischemia. Overall Impression: Normal stress nuclear study. Overall Impression Comments: Mild attenuation mid and basal inferior wall consistant with diaphragm

## 2010-08-23 ENCOUNTER — Encounter: Payer: Self-pay | Admitting: Cardiology

## 2010-08-23 ENCOUNTER — Ambulatory Visit (INDEPENDENT_AMBULATORY_CARE_PROVIDER_SITE_OTHER): Payer: 59 | Admitting: Cardiology

## 2010-08-23 DIAGNOSIS — I2510 Atherosclerotic heart disease of native coronary artery without angina pectoris: Secondary | ICD-10-CM

## 2010-08-23 DIAGNOSIS — E785 Hyperlipidemia, unspecified: Secondary | ICD-10-CM

## 2010-08-25 ENCOUNTER — Telehealth (INDEPENDENT_AMBULATORY_CARE_PROVIDER_SITE_OTHER): Payer: Self-pay | Admitting: *Deleted

## 2010-08-30 NOTE — Assessment & Plan Note (Signed)
Summary: f18m/per pt call/lg/ep   Visit Type:  4 mo follow up Primary Provider:  Donovan Kail  CC:  pt has no complaints today.  History of Present Illness: Ryan Stuart comes in for his CAD and mixed HL. He is having no angina or ischemic sxs. His last lipids were not at goal with LDL of 90 and HDL of 36. He is on Simvastatin 40mg  and he is also on Amlodopine. His LFTs  were normal.  Clinical Reports Reviewed:  Cardiac Cath:  01/26/2010: Cardiac Cath Findings:   Left ventriculogram:  The left ventriculogram performed in the RAO   projection showed good Maurio Baize motion with no areas of hypokinesis.   Estimated ejection fraction was 60%.      The aortic pressure was 175/94 with mean of 124.  Left ventricular   pressure was 175/30.      CONCLUSION:  Coronary artery disease status post prior percutaneous   coronary intervention procedures as described above with 0% stenosis in   the overlapping stent in the proximal to midLAD, 40% narrowing in the   marginal branch of the circumflex artery, 0% stenosis at the stent site   in the posterior descending branch of the right coronary, and normal LV   function.      RECOMMENDATIONS:  The patient has only nonobstructive disease and there   does not appear to be any source of ischemia.  Etiology of the patient's   symptoms are not clear.  Blood pressure is a little labile, and we will   plan adjustments as needed to control his blood pressure, and we will   arrange discharge tomorrow and follow up with Dr. Daleen Squibb.               Bruce Elvera Lennox Juanda Chance, MD, [facility]      01/25/2010: Cardiac Cath Findings:  Coronary artery disease status post prior percutaneous coronary intervention procedures as described above with 0% stenosis in the overlapping stent in the proximal to midLAD, 40% narrowing in the marginal branch of the circumflex artery, 0% stenosis at the stent site in the posterior descending branch of the right coronary, and normal LV function.     RECOMMENDATIONS:  The patient has only nonobstructive disease and there does not appear to be any source of ischemia.  Etiology of the patient's symptoms are not clear.  Blood pressure is a little labile, and we will plan adjustments as needed to control his blood pressure, and we will arrange discharge tomorrow and follow up with Dr. Daleen Squibb.    08/25/2008: Cardiac Cath Findings:  ASSESSMENT:  Successful two-vessel percutaneous coronary intervention as   detailed above with overlapping drug-eluting stents in the left anterior   descending and a single drug-eluting stent in the right posterior   descending artery.      PLAN:  Recommend a minimum of 12 months of antiplatelet therapy with   aspirin and Plavix.               Veverly Fells. Excell Seltzer, MD   08/24/2008: Cardiac Cath Findings:  ASSESSMENT AND PLAN:  This is a 55 year old with a history of type 2   diabetes and hypertension who presented with a non-ST-elevation   myocardial infarction.  The patient was found to have a long 80-90%   proximal to mid LAD stenosis.  This lesion involved the ostium of a   moderate sized second diagonal with a 90% stenosis.  Additionally, there   is a long 90% stenosis in the  proximal to mid PDA and 99% stenosis of   the superior branch of the small PLV.  The patient does have 2   significant and relatively complex coronary lesions that should both be   revascularized.  I discussed the case with Dr. Clifton James.  Both coronary   artery bypass grafting and PCI would be feasible, the PCI would involve   intervening on 2 relatively complex lesions.  We will plan on discussing   the case   with the other interventional staff in the morning with possible staged   PCI to the LAD tomorrow then to the PDA at a later time versus   evaluation by Cardiac Surgery.  This approach would not delay surgery   because the patient did receive Plavix yesterday and would have to wait   a few days before bypass surgery  anyway.               Marca Ancona, MD   Electronically Signed    Current Medications (verified): 1)  Metformin Hcl 500 Mg Tabs (Metformin Hcl) .Marland Kitchen.. 1 Tab Two Times A Day 2)  Aspirin Ec 325 Mg Tbec (Aspirin) .... Take One Tablet By Mouth Daily 3)  Fish Oil 1000 Mg Caps (Omega-3 Fatty Acids) .... 2 Caps Two Times A Day 4)  Isosorbide Mononitrate Cr 30 Mg Xr24h-Tab (Isosorbide Mononitrate) .Marland Kitchen.. 1 Tab Once Daily 5)  Nitroglycerin 0.4 Mg Subl (Nitroglycerin) .... One Tablet Under Tongue Every 5 Minutes As Needed For Chest Pain---May Repeat Times Three 6)  Benazepril Hcl 40 Mg Tabs (Benazepril Hcl) .... Take 1 Tablet By Mouth Once A Day 7)  Bystolic 10 Mg Tabs (Nebivolol Hcl) .Marland Kitchen.. 1 Tab Once Daily 8)  Budeprion Xl 300 Mg Xr24h-Tab (Bupropion Hcl) .Marland Kitchen.. 1 Tab Once Daily 9)  Alprazolam 0.25 Mg Tabs (Alprazolam) .Marland Kitchen.. 1 Tab 1  Times A Day 10)  Amlodipine Besylate 10 Mg Tabs (Amlodipine Besylate) .... Take 1 Tablet By Mouth Once A Day 11)  Simvastatin 40 Mg Tabs (Simvastatin) .... Take 1 Tablet By Mouth Once A Day  Allergies (verified): No Known Drug Allergies  Past History:  Past Medical History: Last updated: 08/02/2009 MYOCARDIAL INFARCTION, ACUTE, SUBENDOCARDIAL (ICD-410.70) CAD, NATIVE VESSEL (ICD-414.01) CHEST PAIN, EXERTIONAL (ICD-786.50) HYPERTENSION (ICD-401.9) HYPERLIPIDEMIA (ICD-272.4) DIABETES MELLITUS, TYPE II (ICD-250.00)  Past Surgical History: Last updated: 09/20/2008 Cath 08/25/08 Cath 08/24/08  Family History: Last updated: 09/20/2008 non contributory  Social History: Last updated: 09/20/2008 Full Time Married  Tobacco Use - Yes.   Risk Factors: Smoking Status: current (09/20/2008)  Review of Systems       negative other than HPI.  Vital Signs:  Patient profile:   55 year old male Height:      69 inches Weight:      232.25 pounds BMI:     34.42 Pulse rate:   64 / minute Resp:     18 per minute BP sitting:   122 / 72  (left arm) Cuff size:    large  Vitals Entered By: Celestia Khat, CMA (August 23, 2010 3:47 PM)  Physical Exam  General:  obese.   Head:  normocephalic and atraumatic Eyes:  PERRLA/EOM intact; conjunctiva and lids normal. Neck:  Neck supple, no JVD. No masses, thyromegaly or abnormal cervical nodes. Chest Crosby Oriordan:  no deformities or breast masses noted Lungs:  Clear bilaterally to auscultation and percussion. Heart:  PMI normal, nl S1 and S2, NO BRUITS. Abdomen:  SOFT, GOOD BS Msk:  Back normal, normal gait. Muscle strength  and tone normal. Pulses:  pulses normal in all 4 extremities Extremities:  No clubbing or cyanosis. Neurologic:  Alert and oriented x 3. Skin:  Intact without lesions or rashes. Psych:  Normal affect.   Impression & Recommendations:  Problem # 1:  CAD, NATIVE VESSEL (ICD-414.01) Assessment Unchanged  The following medications were removed from the medication list:    Azor 10-40 Mg Tabs (Amlodipine-olmesartan) .Marland Kitchen... 1 tab once daily His updated medication list for this problem includes:    Aspirin Ec 325 Mg Tbec (Aspirin) .Marland Kitchen... Take one tablet by mouth daily    Isosorbide Mononitrate Cr 30 Mg Xr24h-tab (Isosorbide mononitrate) .Marland Kitchen... 1 tab once daily    Nitroglycerin 0.4 Mg Subl (Nitroglycerin) ..... One tablet under tongue every 5 minutes as needed for chest pain---may repeat times three    Benazepril Hcl 40 Mg Tabs (Benazepril hcl) .Marland Kitchen... Take 1 tablet by mouth once a day    Bystolic 10 Mg Tabs (Nebivolol hcl) .Marland Kitchen... 1 tab once daily    Amlodipine Besylate 10 Mg Tabs (Amlodipine besylate) .Marland Kitchen... Take 1 tablet by mouth once a day  Orders: EKG w/ Interpretation (93000)  Problem # 2:  HYPERLIPIDEMIA (ICD-272.4) Will change to atorvastatin 80mg /d, check labs in 6 weeks. The following medications were removed from the medication list:    Lipitor 10 Mg Tabs (Atorvastatin calcium) .Marland Kitchen... Take one tablet by mouth daily. His updated medication list for this problem includes:    Lipitor 40  Mg Tabs (Atorvastatin calcium) .Marland Kitchen... Take one tablet by mouth at bedtime  Problem # 3:  MYOCARDIAL INFARCTION, ACUTE, SUBENDOCARDIAL (ICD-410.70)  The following medications were removed from the medication list:    Azor 10-40 Mg Tabs (Amlodipine-olmesartan) .Marland Kitchen... 1 tab once daily His updated medication list for this problem includes:    Aspirin Ec 325 Mg Tbec (Aspirin) .Marland Kitchen... Take one tablet by mouth daily    Isosorbide Mononitrate Cr 30 Mg Xr24h-tab (Isosorbide mononitrate) .Marland Kitchen... 1 tab once daily    Nitroglycerin 0.4 Mg Subl (Nitroglycerin) ..... One tablet under tongue every 5 minutes as needed for chest pain---may repeat times three    Benazepril Hcl 40 Mg Tabs (Benazepril hcl) .Marland Kitchen... Take 1 tablet by mouth once a day    Bystolic 10 Mg Tabs (Nebivolol hcl) .Marland Kitchen... 1 tab once daily    Amlodipine Besylate 10 Mg Tabs (Amlodipine besylate) .Marland Kitchen... Take 1 tablet by mouth once a day  Patient Instructions: 1)  Your physician recommends that you schedule a follow-up appointment in: 1 year with Dr. Daleen Squibb 2)  Your physician recommends that you return for a FASTING lipid profile, liver in 6 weeks  272.4/dfg 3)  Your physician has recommended you make the following change in your medication: decrease simvastatin to 20mg  at bedtime until bottle empty. Then start Atorvastatin (lipitor) Prescriptions: LIPITOR 40 MG TABS (ATORVASTATIN CALCIUM) Take one tablet by mouth at bedtime  #30 x 11   Entered by:   Lisabeth Devoid RN   Authorized by:   Gaylord Shih, MD, [facility]   Signed by:   Lisabeth Devoid RN on 08/23/2010   Method used:   Electronically to        [facility]  [address] [id number] (retail)       [address]       [place], [place]  [address]       Ph: [phone] or [phone]       Fax: [phone]   RxID:   [id number]

## 2010-08-30 NOTE — Progress Notes (Signed)
Summary: pt needs a 90day supply  Phone Note Refill Request Call back at Home Phone [phone] Message from:  Patient  Refills Requested: Medication #1:  LIPITOR 40 MG TABS Take one tablet by mouth at bedtime. pt needs 90day supply  Initial call taken by: Omer Jack,  August 25, 2010 1:36 PM  Follow-up for Phone Call        Rx faxed to pharmacy. Vikki Ports  August 25, 2010 3:20 PM     Prescriptions: LIPITOR 40 MG TABS (ATORVASTATIN CALCIUM) Take one tablet by mouth at bedtime  #90 x 0   Entered by:   Vikki Ports   Authorized by:   Gaylord Shih, MD, [facility]   Signed by:   Vikki Ports on 08/25/2010   Method used:   Faxed to ...       [facility]  [id number] ([facility]ail[facility]       [address]       [place], [place]  [address]       Ph: [phone] or [phone]       Fax: [phone]   RxID:   [id number]

## 2010-09-23 LAB — GLUCOSE, CAPILLARY
Glucose-Capillary: 115 mg/dL — ABNORMAL HIGH (ref 70–99)
Glucose-Capillary: 152 mg/dL — ABNORMAL HIGH (ref 70–99)
Glucose-Capillary: 184 mg/dL — ABNORMAL HIGH (ref 70–99)

## 2010-09-23 LAB — CK TOTAL AND CKMB (NOT AT ARMC)
CK, MB: 1.8 ng/mL (ref 0.3–4.0)
CK, MB: 2.2 ng/mL (ref 0.3–4.0)
Relative Index: 1.6 (ref 0.0–2.5)
Relative Index: 1.7 (ref 0.0–2.5)
Total CK: 110 U/L (ref 7–232)
Total CK: 130 U/L (ref 7–232)

## 2010-09-23 LAB — BASIC METABOLIC PANEL
BUN: 8 mg/dL (ref 6–23)
BUN: 9 mg/dL (ref 6–23)
CO2: 27 mEq/L (ref 19–32)
Calcium: 9.1 mg/dL (ref 8.4–10.5)
Chloride: 104 mEq/L (ref 96–112)
GFR calc non Af Amer: >60 mL/min (ref >60)
Glucose, Bld: 152 mg/dL — ABNORMAL HIGH (ref 70–99)
Potassium: 4 mEq/L (ref 3.5–5.1)
Potassium: 4.1 mEq/L (ref 3.5–5.1)

## 2010-09-23 LAB — CBC
HCT: 40.3 % (ref 39.0–52.0)
HCT: 40.8 % (ref 39.0–52.0)
MCH: 29.6 pg (ref 26.0–34.0)
MCHC: 34.1 g/dL (ref 30.0–36.0)
MCV: 87.8 fL (ref 78.0–100.0)
Platelets: 202 10*3/uL (ref 150–400)
RBC: 4.64 MIL/uL (ref 4.22–5.81)
RDW: 12.9 % (ref 11.5–15.5)
WBC: 6.2 10*3/uL (ref 4.0–10.5)
WBC: 7.9 10*3/uL (ref 4.0–10.5)

## 2010-09-23 LAB — LIPID PANEL
Cholesterol: 149 mg/dL (ref 0–200)
HDL: 45 mg/dL (ref >39)

## 2010-09-23 LAB — DIFFERENTIAL
Basophils Absolute: 0.1 10*3/uL (ref 0.0–0.1)
Basophils Relative: 1 % (ref 0–1)
Eosinophils Absolute: 0.5 10*3/uL (ref 0.0–0.7)
Eosinophils Relative: 6 % — ABNORMAL HIGH (ref 0–5)
Lymphocytes Relative: 35 % (ref 12–46)
Monocytes Absolute: 0.6 10*3/uL (ref 0.1–1.0)

## 2010-09-23 LAB — TROPONIN I
Troponin I: 0.01 ng/mL (ref 0.00–0.06)
Troponin I: 0.01 ng/mL (ref 0.00–0.06)

## 2010-09-23 LAB — HEPARIN LEVEL (UNFRACTIONATED): Heparin Unfractionated: 0.51 IU/mL (ref 0.30–0.70)

## 2010-09-23 LAB — PROTIME-INR: INR: 0.93 (ref 0.00–1.49)

## 2010-10-04 ENCOUNTER — Other Ambulatory Visit (INDEPENDENT_AMBULATORY_CARE_PROVIDER_SITE_OTHER): Payer: 59 | Admitting: *Deleted

## 2010-10-04 DIAGNOSIS — R0789 Other chest pain: Secondary | ICD-10-CM

## 2010-10-10 ENCOUNTER — Encounter (INDEPENDENT_AMBULATORY_CARE_PROVIDER_SITE_OTHER): Payer: 59

## 2010-10-10 DIAGNOSIS — R0989 Other specified symptoms and signs involving the circulatory and respiratory systems: Secondary | ICD-10-CM

## 2010-10-19 LAB — GLUCOSE, CAPILLARY
Glucose-Capillary: 110 mg/dL — ABNORMAL HIGH (ref 70–99)
Glucose-Capillary: 183 mg/dL — ABNORMAL HIGH (ref 70–99)
Glucose-Capillary: 185 mg/dL — ABNORMAL HIGH (ref 70–99)
Glucose-Capillary: 70 mg/dL (ref 70–99)
Glucose-Capillary: 94 mg/dL (ref 70–99)
Glucose-Capillary: 97 mg/dL (ref 70–99)
Glucose-Capillary: 99 mg/dL (ref 70–99)
Glucose-Capillary: 91 mg/dL (ref 70–99)
Glucose-Capillary: 96 mg/dL (ref 70–99)

## 2010-10-24 LAB — BASIC METABOLIC PANEL
BUN: 16 mg/dL (ref 6–23)
BUN: 11 mg/dL (ref 6–23)
BUN: 7 mg/dL (ref 6–23)
CO2: 25 mEq/L (ref 19–32)
CO2: 24 mEq/L (ref 19–32)
Calcium: 9.1 mg/dL (ref 8.4–10.5)
Calcium: 8.4 mg/dL (ref 8.4–10.5)
Chloride: 104 mEq/L (ref 96–112)
Chloride: 103 mEq/L (ref 96–112)
Chloride: 106 mEq/L (ref 96–112)
Creatinine, Ser: 0.8 mg/dL (ref 0.4–1.5)
Creatinine, Ser: 0.82 mg/dL (ref 0.4–1.5)
Creatinine, Ser: 0.86 mg/dL (ref 0.4–1.5)
GFR calc Af Amer: >60 mL/min (ref >60)
GFR calc Af Amer: >60 mL/min (ref >60)
GFR calc non Af Amer: >60 mL/min (ref >60)
Glucose, Bld: 107 mg/dL — ABNORMAL HIGH (ref 70–99)
Glucose, Bld: 107 mg/dL — ABNORMAL HIGH (ref 70–99)
Glucose, Bld: 110 mg/dL — ABNORMAL HIGH (ref 70–99)
Potassium: 3.4 mEq/L — ABNORMAL LOW (ref 3.5–5.1)
Potassium: 4.1 mEq/L (ref 3.5–5.1)

## 2010-10-24 LAB — CARDIAC PANEL(CRET KIN+CKTOT+MB+TROPI)
CK, MB: 7.2 ng/mL — ABNORMAL HIGH (ref 0.3–4.0)
CK, MB: 2.9 ng/mL (ref 0.3–4.0)
CK, MB: 20 ng/mL — ABNORMAL HIGH (ref 0.3–4.0)
CK, MB: 4.5 ng/mL — ABNORMAL HIGH (ref 0.3–4.0)
CK, MB: 9.3 ng/mL — ABNORMAL HIGH (ref 0.3–4.0)
Relative Index: 3.6 — ABNORMAL HIGH (ref 0.0–2.5)
Relative Index: 2.5 (ref 0.0–2.5)
Relative Index: 2.9 — ABNORMAL HIGH (ref 0.0–2.5)
Relative Index: 3.5 — ABNORMAL HIGH (ref 0.0–2.5)
Relative Index: 8 — ABNORMAL HIGH (ref 0.0–2.5)
Relative Index: INVALID (ref 0.0–2.5)
Total CK: 199 U/L (ref 7–232)
Total CK: 189 U/L (ref 7–232)
Total CK: 265 U/L — ABNORMAL HIGH (ref 7–232)
Total CK: 281 U/L — ABNORMAL HIGH (ref 7–232)
Troponin I: 0.2 ng/mL — ABNORMAL HIGH (ref 0.00–0.06)
Troponin I: 0.24 ng/mL — ABNORMAL HIGH (ref 0.00–0.06)
Troponin I: 1 ng/mL (ref 0.00–0.06)
Troponin I: 2.06 ng/mL (ref 0.00–0.06)
Troponin I: 2.19 ng/mL (ref 0.00–0.06)

## 2010-10-24 LAB — GLUCOSE, CAPILLARY
Glucose-Capillary: 104 mg/dL — ABNORMAL HIGH (ref 70–99)
Glucose-Capillary: 129 mg/dL — ABNORMAL HIGH (ref 70–99)
Glucose-Capillary: 105 mg/dL — ABNORMAL HIGH (ref 70–99)
Glucose-Capillary: 110 mg/dL — ABNORMAL HIGH (ref 70–99)
Glucose-Capillary: 115 mg/dL — ABNORMAL HIGH (ref 70–99)
Glucose-Capillary: 117 mg/dL — ABNORMAL HIGH (ref 70–99)
Glucose-Capillary: 134 mg/dL — ABNORMAL HIGH (ref 70–99)
Glucose-Capillary: 139 mg/dL — ABNORMAL HIGH (ref 70–99)
Glucose-Capillary: 155 mg/dL — ABNORMAL HIGH (ref 70–99)
Glucose-Capillary: 180 mg/dL — ABNORMAL HIGH (ref 70–99)

## 2010-10-24 LAB — CBC
HCT: 41.4 % (ref 39.0–52.0)
HCT: 41.7 % (ref 39.0–52.0)
Hemoglobin: 13.8 g/dL (ref 13.0–17.0)
Hemoglobin: 13.9 g/dL (ref 13.0–17.0)
Hemoglobin: 14.7 g/dL (ref 13.0–17.0)
Hemoglobin: 15.2 g/dL (ref 13.0–17.0)
MCHC: 34.1 g/dL (ref 30.0–36.0)
MCHC: 35.3 g/dL (ref 30.0–36.0)
MCHC: 35.5 g/dL (ref 30.0–36.0)
MCHC: 35.7 g/dL (ref 30.0–36.0)
MCV: 86.7 fL (ref 78.0–100.0)
MCV: 86 fL (ref 78.0–100.0)
MCV: 86.6 fL (ref 78.0–100.0)
MCV: 87.6 fL (ref 78.0–100.0)
MCV: 87.9 fL (ref 78.0–100.0)
Platelets: 256 10*3/uL (ref 150–400)
Platelets: 215 10*3/uL (ref 150–400)
Platelets: 239 10*3/uL (ref 150–400)
RBC: 5.36 MIL/uL (ref 4.22–5.81)
RBC: 4.51 MIL/uL (ref 4.22–5.81)
RBC: 4.75 MIL/uL (ref 4.22–5.81)
RBC: 4.77 MIL/uL (ref 4.22–5.81)
RBC: 4.96 MIL/uL (ref 4.22–5.81)
RDW: 11.8 % (ref 11.5–15.5)
RDW: 12.6 % (ref 11.5–15.5)
RDW: 12.7 % (ref 11.5–15.5)
RDW: 12.7 % (ref 11.5–15.5)
WBC: 8.3 10*3/uL (ref 4.0–10.5)
WBC: 8.6 10*3/uL (ref 4.0–10.5)

## 2010-10-24 LAB — DIFFERENTIAL
Basophils Absolute: 0.1 10*3/uL (ref 0.0–0.1)
Basophils Relative: 1 % (ref 0–1)
Eosinophils Absolute: 0.3 10*3/uL (ref 0.0–0.7)
Eosinophils Absolute: 0.2 10*3/uL (ref 0.0–0.7)
Eosinophils Relative: 3 % (ref 0–5)
Lymphocytes Relative: 29 % (ref 12–46)
Lymphs Abs: 2.4 10*3/uL (ref 0.7–4.0)
Monocytes Relative: 6 % (ref 3–12)
Monocytes Relative: 5 % (ref 3–12)
Neutro Abs: 4.6 10*3/uL (ref 1.7–7.7)
Neutrophils Relative %: 59 % (ref 43–77)
Neutrophils Relative %: 63 % (ref 43–77)

## 2010-10-24 LAB — POCT CARDIAC MARKERS
CKMB, poc: 5.3 ng/mL (ref 1.0–8.0)
Myoglobin, poc: 78.1 ng/mL (ref 12–200)
Myoglobin, poc: 90.9 ng/mL (ref 12–200)

## 2010-10-24 LAB — HEMOGLOBIN A1C: Mean Plasma Glucose: 131 mg/dL

## 2010-10-24 LAB — HEPARIN LEVEL (UNFRACTIONATED)
Heparin Unfractionated: 0.28 IU/mL — ABNORMAL LOW (ref 0.30–0.70)
Heparin Unfractionated: 0.58 IU/mL (ref 0.30–0.70)

## 2010-10-24 LAB — HEPATIC FUNCTION PANEL
Albumin: 3.9 g/dL (ref 3.5–5.2)
Alkaline Phosphatase: 66 U/L (ref 39–117)
Bilirubin, Direct: 0.6 mg/dL — ABNORMAL HIGH (ref 0.0–0.3)
Total Bilirubin: 1.4 mg/dL — ABNORMAL HIGH (ref 0.3–1.2)

## 2010-10-24 LAB — PROTIME-INR
INR: 0.9 (ref 0.00–1.49)
INR: 1 (ref 0.00–1.49)
Prothrombin Time: 12.6 seconds (ref 11.6–15.2)
Prothrombin Time: 13.1 seconds (ref 11.6–15.2)

## 2010-10-24 LAB — MAGNESIUM: Magnesium: 2.1 mg/dL (ref 1.5–2.5)

## 2010-10-24 LAB — LIPID PANEL
Cholesterol: 243 mg/dL — ABNORMAL HIGH (ref 0–200)
LDL Cholesterol: 173 mg/dL — ABNORMAL HIGH (ref 0–99)
VLDL: 40 mg/dL (ref 0–40)

## 2010-11-21 NOTE — Cardiovascular Report (Signed)
NAMEMASSIMILIANO, [name] NO.:  [id number]   MEDICAL RECORD NO.:  [id number]          PATIENT TYPE:  INP   LOCATION:  3735                         FACILITY:  [facility]   PHYSICIAN:  Marca Ancona, MD      DATE OF BIRTH:  09-Mar-1956   DATE OF PROCEDURE:  DATE OF DISCHARGE:                            CARDIAC CATHETERIZATION   PROCEDURES:  1. Left heart catheterization.  2. Coronary angiography.  3. Left ventriculography.   INDICATION:  Non-ST elevation MI in a patient with a history of  hypertension and diabetes.   PROCEDURE NOTE:  After informed consent was obtained, the right groin  was sterilely prepped and draped.  A 6-French arterial sheath was placed  in the right common femoral artery.  The left coronary artery was  engaged using a 6-French JL4 catheter.  The right coronary artery was  engaged using the 6-French JR4 catheter and the left ventricle was  entered using the 6-French angled pigtail catheter.  There were no  complications.   FINDINGS:  1. Hemodynamics:  Aorta 142/87, LV 146/7/15.  2. Left ventriculography:  EF was estimated at 65%.  There were no      regional wall motion abnormalities.  There was no mitral      regurgitation.  3. Coronary angiography:  The coronary system is right dominant.  In      the RCA, there is a 30-40% long distal RCA stenosis.  The RCA      bifurcates into the PLV and PDA.  The PLV was a small vessel.  The      superior branch of the PLV had a tight 95% stenosis.  The PDA had a      long proximal to mid 80 and 90% stenosis.  The left main did not      have significant atherosclerotic disease.  The LAD had a 30% ostial      stenosis.  There was a long area of stenosis from the proximal to      mid LAD reaching 80-90% stenosis at the most.  The tightest      blockage was located close to the ostium of the second diagonal.      The second diagonal was a moderate-sized vessel.  It did have a 90%      ostial lesion.  The first  diagonal was a small vessel with a 70%      proximal lesion.  The remainder of the LAD showed luminal      irregularities.  The left circumflex had 3 obtuse marginals.  The      first obtuse marginal was a small vessel with minimal disease.      Second obtuse marginal had serial 40% stenosis.  It was a large      vessel.  The AV circumflex itself had about a 50% stenosis at the      site of the take-off of the second and third obtuse marginals which      originated close to each other.   ASSESSMENT AND PLAN:  This is a 55 year old  with a history of type 2  diabetes and hypertension who presented with a non-ST-elevation  myocardial infarction.  The patient was found to have a long 80-90%  proximal to mid LAD stenosis.  This lesion involved the ostium of a  moderate sized second diagonal with a 90% stenosis.  Additionally, there  is a long 90% stenosis in the proximal to mid PDA and 99% stenosis of  the superior branch of the small PLV.  The patient does have 2  significant and relatively complex coronary lesions that should both be  revascularized.  I discussed the case with Dr. Clifton James.  Both coronary  artery bypass grafting and PCI would be feasible, the PCI would involve  intervening on 2 relatively complex lesions.  We will plan on discussing  the case  with the other interventional staff in the morning with possible staged  PCI to the LAD tomorrow then to the PDA at a later time versus  evaluation by Cardiac Surgery.  This approach would not delay surgery  because the patient did receive Plavix yesterday and would have to wait  a few days before bypass surgery anyway.      Marca Ancona, MD  Electronically Signed     DM/MEDQ  D:  08/24/2008  T:  08/25/2008  Job:  [id number]

## 2010-11-21 NOTE — H&P (Signed)
NAMECRISTIANO, [name]                  ACCOUNT NO.:  [id number]   MEDICAL RECORD NO.:  [id number]          PATIENT TYPE:  INP   LOCATION:  3735                         FACILITY:  [facility]   PHYSICIAN:  Jesse Sans. Wall, MD, FACCDATE OF BIRTH:  Jan 10, 1956   DATE OF ADMISSION:  08/23/2008  DATE OF DISCHARGE:                              HISTORY & PHYSICAL   HISTORY OF PRESENT ILLNESS:  Mr. Ryan Stuart is a delightful 55 year old  married white male, who is admitted today from the [facility]  Emergency Room.   Last evening, while he was unloading cases of Orange juice, he had a  stomach ache, pressure in center of his chest.  It lasted for about 3  hours.  He took an aspirin.  He denied any other symptoms.   He went to sleep and this morning he awoke again with this chest  tightness.   He reported to the emergency room about 11 o'clock.  There, his EKG was  unremarkable, but his point of care markers were positive with a  troponin of 0.12 x2.  He was transferred over after being started on  intravenous heparin, given aspirin and on IV nitroglycerin.   He is currently pain free.   His cardiac risk factors include age, male sex, type 2 diabetes, mixed  hyperlipidemia, and hypertension.  He has a long history of smoking, but  quit about 2 years ago.   His family history is noncontributory.   He has no known drug allergies.   His meds on admission were:  1. Metformin 500 mg p.o. b.i.d.  2. Aspirin 81 mg a day.  3. Fish oil 2 tablets a day.  4. Lisinopril 20 mg per day.   He is intolerant, but not allergic to NIASPAN.   SOCIAL HISTORY:  He is married.  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  His job has been very stressful, now doing 2 jobs wrapped into  1.   He does not exercise on a regular basis.  He has a daughter and a son  who I met tonight.  His wife is also in the room.   REVIEW OF SYSTEMS:  All systems were negative other than the HPI.   PHYSICAL  EXAMINATION:  VITAL SIGNS:  Blood pressure 160/80, his pulse is  70 and regular, respiratory rate is 18, sat is 94%.  HEENT:  Pupils equal, round, response to light and accommodation.  Sclerae are clear.  Dentition satisfactory.  NECK:  Supple.  Carotids upstrokes were equal bilaterally without  bruits.  There is no JVD.  Thyroid is not enlarged.  Trachea is midline.  LUNGS:  Clear to auscultation and percussion.  HEART:  Nondisplaced PMI.  Soft S1 and S2.  There is no murmur.  ABDOMEN:  Soft, good bowel sounds.  No midline or flank bruit.  There is  no obvious organomegaly.  EXTREMITIES:  No cyanosis, clubbing, or edema.  Pulses are brisk, both  dorsalis pedis and posterior tibial distally.  Femoral pulses are  palpable bilaterally and strong.  NEUROLOGIC:  Intact.  SKIN:  Unremarkable.   ASSESSMENT:  1. Non-ST segment elevation myocardial infarction.  2. Hypertension.  3. Type 2 diabetes.  4. Mixed hyperlipidemia.  5. Remote tobacco.   PLAN:  1. Continue intravenous nitroglycerin, but titrate up for blood      pressure of less than 140 systolic.  2. Continue IV heparin.  3. Plavix 600 mg tonight and 75 mg p.o. q.a.m.  4. Aspirin 325 mg per day.  5. Metoprolol 25 mg p.o. now and q.12 h.  6. Simvastatin 40 mg p.o. nightly.  7. Cardiac rehab.  8. Weight reduction.   Indications, risks, and potential benefits has been discussed for  catheterization and possible PCI tomorrow.  He and his family agreed to  proceed.      Thomas C. Daleen Squibb, MD, [facility]  Electronically Signed     TCW/MEDQ  D:  08/23/2008  T:  08/24/2008  Job:  [id number]   cc:   Miguel Aschoff, M.D.

## 2010-11-21 NOTE — Assessment & Plan Note (Signed)
[facility]                            CARDIOLOGY OFFICE NOTE   NAME:Ryan Stuart, Ryan Stuart                         MRN:          [id number]  DATE:09/20/2008                            DOB:          01-Jan-1956    Ryan Stuart returns today.  He presented with exertional angina.   His catheterization showed two-vessel coronary artery disease.  He had a  drug-eluting stent in the right coronary artery and then overlapping  drug-eluting stents in the LAD.  His EF was 65%.  He ruled in for a non-  ST-segment-elevation MI.   Since discharge, his biggest problem has been his arthritis.  He was  told not to take his diclofenac because of being on Plavix and aspirin,  not to mention being in the TRACER study.  He said he has tried Tylenol,  but he is afraid to do that with a history of hepatitis.   He is currently having no angina or ischemic symptoms.  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   His current meds include:  1. Metformin 500 mg 1 tab 2 times a day.  2. Aspirin 81 mg 2 tabs twice a day.  3. Fish oil 1000 mg 2 tabs twice a day.  4. Plavix 75 mg per day.  5. Amlodipine/benazepril 5/20 one daily.  6. Lopressor 50 mg p.o. b.i.d.  7. Simvastatin 40 mg nightly.  8. Imdur 30 mg daily.  9. Nitroglycerin p.r.n.  10.He is in the TRACER study.   PHYSICAL EXAMINATION:  VITAL SIGNS:  Blood pressure is 129/77 in the  left arm, pulse is 58 and regular.  His EKG confirms sinus brady, no ST-  segment changes.  He is 69 inches and weighs 216 pounds.  BMI is 32.  GENERAL:  He is in no acute distress.  HEENT:  He has a full beard.  NECK:  Supple.  Carotid upstrokes were equal bilaterally without bruits.  No JVD.  Thyroid is not enlarged.  Trachea is midline.  CHEST:  Lungs are clear to auscultation and percussion.  HEART:  A nondisplaced PMI.  ABDOMEN:  Soft, good bowel sounds.  EXTREMITIES:  There were no cyanosis, clubbing, or edema.  Pulses  are  present.  His gait is very impaired with his knees and his arthritis.   Ryan Stuart is doing well from my standpoint.  The arthritis is a major  issue particularly in light of the remote history of hepatitis.  Apparently, this was about 10 years ago.  His transaminases were only  minimally elevated.   We have discussed this with the PharmD in our office as well as with the  research nurses.  For the meantime, we will try Tylenol 650 mg 3 times a  day which he has tried with some relief.  We will follow up on his blood  work closely.  He will need LFTs as well as lipids in the next few  weeks.   I will plan on seeing him back in 4 months.   I have released  him to go back to work in a couple of weeks.     Thomas C. Daleen Squibb, MD, [facility]  Electronically Signed    TCW/MedQ  DD: 09/20/2008  DT: 09/21/2008  Job #: [id number]

## 2010-11-21 NOTE — Cardiovascular Report (Signed)
Ryan Stuart, Ryan Stuart                  ACCOUNT NO.:  [id number]   MEDICAL RECORD NO.:  [id number]          PATIENT TYPE:  INP   LOCATION:  2504                         FACILITY:  [facility]   PHYSICIAN:  Veverly Fells. Excell Seltzer, MD  DATE OF BIRTH:  [birth year]   DATE OF PROCEDURE:  DATE OF DISCHARGE:  08/25/2008                            CARDIAC CATHETERIZATION   PROCEDURE:  1. Percutaneous transluminal coronary angioplasty and stenting of the      left anterior descending.  2. Percutaneous transluminal coronary angioplasty and stenting of the      right posterior descending artery.  3. Perclose of the right femoral artery.   INDICATIONS:  Ryan Stuart is a 55 year old gentleman who presented with an  acute coronary syndrome.  He ruled in for a myocardial infarction with  elevated cardiac markers.  He underwent diagnostic catheterization  yesterday that demonstrated complex two-vessel coronary artery disease  with long area of severe stenosis in the mid LAD and a long area of  severe stenosis in the right PDA.  After review of his films, we elected  to proceed with PCI.   Risks and indications of the procedure were reviewed with the patient.  Informed consent was obtained.  The right groin was prepped, draped, and  anesthetized with 1% lidocaine.  Using modified Seldinger technique, a 6-  French sheath was placed in the right femoral artery.  Angiomax was used  for anticoagulation.  The patient had been pretreated with Plavix.  An  XB 3.5, 6-French guide catheter was inserted.  The LAD was wired with a  Cougar guidewire, once the therapeutic ACT was achieved.  There was a  long area of stenosis throughout the midportion of the LAD involving a  moderate-sized second diagonal branch.  The vessel was predilated with a  2.5 x 20-mm Sprinter balloon, which was taken to 8 atmospheres on a  single inflation.  It appeared the entire mid LAD would require stenting  due to severe diffuse stenosis.  I started  with a 2.5 x 30-mm Endeavor  stent, which was carefully positioned just distal to the moderate-sized  second diagonal branch and then it was deployed at 12 atmospheres.  It  appeared well expanded.  The stent balloon was then taken back and a  single inflation was done over the more proximal area of the LAD also to  12 atmospheres.  That balloon was used to estimate the length and there  still remained over 25 mm of diffuse disease.  A second stent was then  positioned.  A 3.0 x 30-mm Endeavor was chosen.  It was carefully  positioned and deployed at 12 atmospheres.  The distal stent was then  postdilated with a 2.75 x 21-mm [place] Sprinter balloon, which was taken to  14 atmospheres over the distal portion and 16 atmospheres over the mid  and proximal portion.  A 3.25 x 21-mm [place] Sprinter balloon was then used  to post dilate the mid and proximal portions of the stented segment and  this balloon was taken to 16 atmospheres.  There was an excellent  angiographic result at the completion of the procedure, all side  branches remained patent.  The moderate-sized second diagonal remained  widely patent with TIMI III flow and there was no residual stenosis.  At  that point, I elected to move on to the right coronary artery.  The  right coronary artery has severe tortuosity.  There was a long segment  of 80-90% stenosis in the right PDA.  The vessel was wired with a  moderate amount of difficulty using the same Cougar guidewire.  There  was some mild diffuse disease in the distal right coronary artery, but  it was clearly nonobstructive.  The same 2.5 x 20-mm Sprinter Legend  balloon was used to predilate the lesion and was inflated to 8  atmospheres.  Following predilatation, balloon was used to measure the  length of the lesion.  It was very difficult to pass the balloon, but I  made a single attempt with aggressive guide seating to pass a 2.5 x 24-  mm Endeavor stent.  This was unsuccessful.  The  wire actually came out  of the PDA and was placed in the posterolateral branch.  A second wire  was passed into the PDA and used as a buddy wire.  The stent was then  successfully advanced into the PDA and carefully positioned to cover the  entire segment of diseased vessel.  It was deployed at 12 atmospheres  and appeared well expanded.  The stent was then postdilated with a 2.75  x 12-mm [place] Voyager balloon, which was taken to 16 atmospheres on 2  inflations.  At the completion of the procedure, there was an excellent  angiographic result with TIMI III flow in the vessel.  There was an area  of pseudostenosis in the mid vessel that resolved after pulling the wire  out.  The guide catheter was then removed and the femoral arteriotomy  was closed with a Perclose device.   ASSESSMENT:  Successful two-vessel percutaneous coronary intervention as  detailed above with overlapping drug-eluting stents in the left anterior  descending and a single drug-eluting stent in the right posterior  descending artery.   PLAN:  Recommend a minimum of 12 months of antiplatelet therapy with  aspirin and Plavix.      Veverly Fells. Excell Seltzer, MD  Electronically Signed     MDC/MEDQ  D:  08/25/2008  T:  08/26/2008  Job:  [id number]   cc:   C. Duane Lope, M.D.

## 2010-11-21 NOTE — Discharge Summary (Signed)
Ryan Stuart                  ACCOUNT NO.:  [id number]   MEDICAL RECORD NO.:  [id number]          PATIENT TYPE:  INP   LOCATION:  2504                         FACILITY:  [facility]   PHYSICIAN:  Bevelyn Buckles. Bensimhon, MDDATE OF BIRTH:  May 04, 1956   DATE OF ADMISSION:  08/23/2008  DATE OF DISCHARGE:  08/27/2008                               DISCHARGE SUMMARY   PRIMARY CARDIOLOGIST:  Ryan Fus C. Wall, MD, [facility].   PRIMARY CARE Brodi Nery:  C. Duane Lope, M.D.   DISCHARGE DIAGNOSIS:  Non-ST-segment elevation myocardial infarction.   SECONDARY DIAGNOSES:  1. Coronary artery disease, status post successful percutaneous      coronary intervention and stenting of the left anterior descending      and posterior descending artery with drug-eluting stents this      admission.  2. Hypertension.  3. Hyperlipidemia.  4. Type 2 diabetes mellitus.  5. Obesity.  6. Remote tobacco abuse.   ALLERGIES:  NIASPAN.   PROCEDURES:  Left heart cardiac catheterization with successful  percutaneous coronary intervention and stenting of the left anterior  descending with placement of 2 overlapping Endeavor drug-eluting stents.  Percutaneous coronary intervention and stenting of the posterior  descending artery with placement of an Endeavor drug-eluting stent.   HISTORY OF PRESENT ILLNESS:  A 55 year old Caucasian male without prior  history of coronary artery disease who was in his usual state of health  until August 22, 2008 when he developed exertional substernal chest  discomfort lasting approximately 3 hours.  The patient was able to fall  sleep, but awoke in the morning of August 23, 2008 with recurrent  chest tightness.  He presented to the [facility] where ECG  was unremarkable, but point-of-care markers revealed a troponin 0.12.  The patient was transferred to the [facility] ED for further evaluation.  In the ED, the patient was pain free and he was admitted for further  evaluation and  management of non-ST-segment elevation MI.   HOSPITAL COURSE:  Ryan Stuart underwent left heart cardiac catheterization  on August 24, 2008 revealing normal LV function with an EF of 65%.  He  had an 80-90% stenosis in the proximal LAD and 80-90% stenosis in the  proximal PDA.  He otherwise had nonobstructive as well as small vessel  disease.  Films were reviewed and it was felt that he would benefit from  PCI of the LAD and PDA.  He was taken back to cath lab on August 25, 2008 where he underwent overlapping Endeavor drug-eluting stent  placement in the LAD with a single drug-eluting stent placement in the  PDA.  The patient tolerated the procedure well, however, postprocedure  was noted to have elevation in his cardiac markers with a rise in his CK-  MB from 4.5 to 20.0 before trending back down.  The patient had a mild  soreness in his chest but was no acute distress and his ECG remained at  baseline without acute changes.  We did add long-acting nitrate to his  regimen with relief of symptoms.  He has been ambulating  without  recurrent symptoms or limitations.  We will plan to discharge him home  today in good condition.   Ryan Stuart has been hypertensive throughout his admission.  We have  titrated beta-blocker, ACE inhibitor, and calcium-channel blocker  therapy with marked improvement in his left pressures.  The patient will  plan to record his blood pressures at home and bring this record with  him on followup appointment with Dr. Daleen Stuart on March 10.   DISCHARGE LABORATORY DATA:  Hemoglobin 15.2, hematocrit 42.7, WBC 9.4,  platelets 235, INR 1.0.  Sodium 138, potassium 4.1, chloride 106, CO2 of  25, BUN 7, creatinine 0.86, glucose 107, total bilirubin 1.4, alkaline  phosphatase 66, AST 64, ALT 43, total protein 7.0, albumin 3.9, calcium  8.5. magnesium 2.1.  Hemoglobin A1c 6.2.  Peak CK 199, peak MB 20.4  (occurred postprocedure), peak troponin-I 2.42 (postprocedure), total   cholesterol 243, triglycerides 198, HDL 30, LDL 173, and TSH 3.652.   DISPOSITION:  The patient will be discharged home today in good  condition.   FOLLOWUP PLANS AND APPOINTMENTS:  We have arranged for followup with Dr.  Valera Stuart on September 15, 2008 at 10:45 a.m.  We have asked to follow up  with his primary care Ryan Stuart.   DISCHARGE MEDICATIONS:  1. Tracer study drug daily.  2. Aspirin 325 mg daily.  3. Plavix 75 mg daily.  4. Amlodipine/benazepril 5/20 mg daily.  5. Lopressor 50 mg b.i.d.  6. Simvastatin 40 mg at bedtime.  7. Metformin 500 mg b.i.d. to be resumed on August 28, 2008.  8. Imdur 30 mg daily.  9. Nitroglycerin 0.4 mg sublingual p.r.n. chest pain.   OUTSTANDING LAB STUDIES:  None.   DURATION OF DISCHARGE/ENCOUNTER:  60 minutes including physician time.      Ryan Stuart, Ryan Stuart      Bevelyn Buckles. Bensimhon, MD  Electronically Signed    CB/MEDQ  D:  08/27/2008  T:  08/27/2008  Job:  [id number]   cc:   C. Duane Lope, M.D.

## 2011-01-15 ENCOUNTER — Telehealth: Payer: Self-pay | Admitting: Cardiology

## 2011-01-15 NOTE — Telephone Encounter (Signed)
Pt calling sent RX to PCP Dr. Tenny Craw and they rejected it. Pt thinks Dr. Daleen Squibb wrote RX for Alprazolam 25mg  Pt wants to know if Dr. Daleen Squibb prescribed this mediation for pt. Pt ran out of medication x1wk ago  Pharmacy: [facility] on [address]

## 2011-01-17 NOTE — Telephone Encounter (Signed)
Spoke with Dr. Daleen Squibb on 01/15/11.  He did not write prescription Refer to pcp Mylo Red RN

## 2011-02-19 ENCOUNTER — Other Ambulatory Visit: Payer: Self-pay | Admitting: *Deleted

## 2011-02-19 MED ORDER — ATORVASTATIN CALCIUM 80 MG PO TABS: 80.0000 mg | ORAL_TABLET | Freq: Every day | ORAL | Status: DC

## 2017-04-08 DIAGNOSIS — Z01818 Encounter for other preprocedural examination: Secondary | ICD-10-CM

## 2017-06-11 DIAGNOSIS — M1711 Unilateral primary osteoarthritis, right knee: Secondary | ICD-10-CM

## 2017-06-11 DIAGNOSIS — Z96651 Presence of right artificial knee joint: Secondary | ICD-10-CM

## 2017-06-12 DIAGNOSIS — M1711 Unilateral primary osteoarthritis, right knee: Secondary | ICD-10-CM | POA: Diagnosis not present

## 2017-06-12 DIAGNOSIS — Z96651 Presence of right artificial knee joint: Secondary | ICD-10-CM | POA: Diagnosis not present

## 2017-06-13 DIAGNOSIS — Z96651 Presence of right artificial knee joint: Secondary | ICD-10-CM | POA: Diagnosis not present

## 2017-06-13 DIAGNOSIS — M1711 Unilateral primary osteoarthritis, right knee: Secondary | ICD-10-CM | POA: Diagnosis not present

## 2020-12-08 ENCOUNTER — Encounter (HOSPITAL_COMMUNITY): Payer: Self-pay

## 2020-12-08 ENCOUNTER — Other Ambulatory Visit: Payer: Self-pay

## 2020-12-08 ENCOUNTER — Inpatient Hospital Stay (HOSPITAL_COMMUNITY)
Admission: EM | Admit: 2020-12-08 | Discharge: 2020-12-10 | DRG: 854 | Disposition: A | Discharge status: Home / Self Care | Payer: No Typology Code available for payment source | Attending: Internal Medicine | Admitting: Internal Medicine

## 2020-12-08 ENCOUNTER — Emergency Department (HOSPITAL_COMMUNITY): Payer: No Typology Code available for payment source

## 2020-12-08 DIAGNOSIS — I252 Old myocardial infarction: Secondary | ICD-10-CM

## 2020-12-08 DIAGNOSIS — Z7982 Long term (current) use of aspirin: Secondary | ICD-10-CM

## 2020-12-08 DIAGNOSIS — N135 Crossing vessel and stricture of ureter without hydronephrosis: Secondary | ICD-10-CM | POA: Diagnosis present

## 2020-12-08 DIAGNOSIS — N202 Calculus of kidney with calculus of ureter: Secondary | ICD-10-CM | POA: Diagnosis present

## 2020-12-08 DIAGNOSIS — N400 Enlarged prostate without lower urinary tract symptoms: Secondary | ICD-10-CM | POA: Diagnosis present

## 2020-12-08 DIAGNOSIS — D696 Thrombocytopenia, unspecified: Secondary | ICD-10-CM | POA: Diagnosis present

## 2020-12-08 DIAGNOSIS — N136 Pyonephrosis: Secondary | ICD-10-CM | POA: Diagnosis present

## 2020-12-08 DIAGNOSIS — I2510 Atherosclerotic heart disease of native coronary artery without angina pectoris: Secondary | ICD-10-CM | POA: Diagnosis present

## 2020-12-08 DIAGNOSIS — N390 Urinary tract infection, site not specified: Secondary | ICD-10-CM | POA: Diagnosis present

## 2020-12-08 DIAGNOSIS — Z79899 Other long term (current) drug therapy: Secondary | ICD-10-CM

## 2020-12-08 DIAGNOSIS — Z87891 Personal history of nicotine dependence: Secondary | ICD-10-CM

## 2020-12-08 DIAGNOSIS — A419 Sepsis, unspecified organism: Secondary | ICD-10-CM | POA: Diagnosis not present

## 2020-12-08 DIAGNOSIS — N179 Acute kidney failure, unspecified: Secondary | ICD-10-CM | POA: Diagnosis present

## 2020-12-08 DIAGNOSIS — E119 Type 2 diabetes mellitus without complications: Secondary | ICD-10-CM

## 2020-12-08 DIAGNOSIS — I10 Essential (primary) hypertension: Secondary | ICD-10-CM | POA: Diagnosis present

## 2020-12-08 DIAGNOSIS — Z96651 Presence of right artificial knee joint: Secondary | ICD-10-CM | POA: Diagnosis present

## 2020-12-08 DIAGNOSIS — N201 Calculus of ureter: Secondary | ICD-10-CM

## 2020-12-08 DIAGNOSIS — R6520 Severe sepsis without septic shock: Secondary | ICD-10-CM | POA: Diagnosis present

## 2020-12-08 DIAGNOSIS — E785 Hyperlipidemia, unspecified: Secondary | ICD-10-CM | POA: Diagnosis present

## 2020-12-08 DIAGNOSIS — Z20822 Contact with and (suspected) exposure to covid-19: Secondary | ICD-10-CM | POA: Diagnosis present

## 2020-12-08 DIAGNOSIS — N319 Neuromuscular dysfunction of bladder, unspecified: Secondary | ICD-10-CM | POA: Diagnosis present

## 2020-12-08 HISTORY — DX: Disorder of kidney and ureter, unspecified: N28.9

## 2020-12-08 HISTORY — DX: Type 2 diabetes mellitus without complications: E11.9

## 2020-12-08 MED ORDER — SODIUM CHLORIDE 0.9 % IV SOLN
1.0000 g | Freq: Once | INTRAVENOUS | Status: AC
  Administered 2020-12-08: 1 g via INTRAVENOUS
  Filled 2020-12-08: qty 10

## 2020-12-08 MED ORDER — HYDROMORPHONE HCL 1 MG/ML IJ SOLN
1.0000 mg | Freq: Once | INTRAMUSCULAR | Status: AC
  Administered 2020-12-08: 1 mg via INTRAVENOUS
  Filled 2020-12-08: qty 1

## 2020-12-08 MED ORDER — SODIUM CHLORIDE 0.9 % IV SOLN
INTRAVENOUS | Status: DC
  Administered 2020-12-08: 1000 mL via INTRAVENOUS

## 2020-12-08 NOTE — ED Provider Notes (Signed)
[facility]-EMERGENCY DEPT Provider Note   CSN: [id number] Arrival date & time: 12/08/20  2228     History Chief Complaint  Patient presents with  . Flank Pain    Ryan Stuart is a 65 y.o. male.  The history is provided by the patient and medical records.    65 year old male with history of diabetes, presenting to the ED from [facility] as a transfer for further evaluation of obstructive and infected left renal stone.  States he has been experiencing some pain in his left flank, attributed this to catching his wife during a fall.  States he was actually visiting his wife at [facility] today and he was taken down to the ER because he did not "look well".  In the ED he was found to have an 76mm left UPJ stone with hydronephrosis, lactate of 4.8, procalcitonin of 17, and SrCr of 3.  He was transferred here for definitive management.  On arrival he is HD stable and in no pain at all.  covid test was negative.  All documentation/labs are present at bedside with patient.  He does have hx of stones in the past requiring intervention.  Past Medical History:  Diagnosis Date  . Diabetes mellitus without complication (HCC)   . Myocardial infarction (HCC) 08/22/2008  . Renal disorder     Patient Active Problem List   Diagnosis Date Noted  . ANXIETY STATE, UNSPECIFIED 02/21/2010  . MYOCARDIAL INFARCTION, ACUTE, SUBENDOCARDIAL 08/02/2009  . CAD, NATIVE VESSEL 08/02/2009  . DIABETES MELLITUS, TYPE II 04/18/2009  . HYPERLIPIDEMIA 04/18/2009  . HYPERTENSION 04/18/2009  . CHEST PAIN, EXERTIONAL 04/18/2009    Past Surgical History:  Procedure Laterality Date  . REPLACEMENT TOTAL KNEE Right 2018       History reviewed. No pertinent family history.  Social History   Tobacco Use  . Smoking status: Former Smoker    Quit date: 2005    Years since quitting: 17.4  . Smokeless tobacco: Never Used  Substance Use Topics  . Alcohol use: Not Currently     Home Medications Prior to Admission medications   Medication Sig Start Date End Date Taking? Authorizing Provider  atorvastatin (LIPITOR) 80 MG tablet Take 1 tablet (80 mg total) by mouth daily. 02/19/11 02/19/12  Herby Abraham, MD    Allergies    Patient has no known allergies.  Review of Systems   Review of Systems  Genitourinary: Positive for flank pain.  All other systems reviewed and are negative.   Physical Exam Updated Vital Signs BP 125/76 (BP Location: Left Arm)   Pulse 91   Temp 98.2 F (36.8 C) (Oral)   Resp 18   Ht 5\' 9"  (1.753 m)   Wt 90.7 kg   SpO2 98%   BMI 29.53 kg/m   Physical Exam Vitals and nursing note reviewed.  Constitutional:      Appearance: He is well-developed.     Comments: Appears comfortable  HENT:     Head: Normocephalic and atraumatic.  Eyes:     Conjunctiva/sclera: Conjunctivae normal.     Pupils: Pupils are equal, round, and reactive to light.  Cardiovascular:     Rate and Rhythm: Normal rate and regular rhythm.     Heart sounds: Normal heart sounds.  Pulmonary:     Effort: Pulmonary effort is normal.     Breath sounds: Normal breath sounds.  Abdominal:     General: Bowel sounds are normal.     Palpations: Abdomen  is soft.  Musculoskeletal:        General: Normal range of motion.     Cervical back: Normal range of motion.  Skin:    General: Skin is warm and dry.  Neurological:     Mental Status: He is alert and oriented to person, place, and time.     ED Results / Procedures / Treatments   Labs (all labs ordered are listed, but only abnormal results are displayed) Labs Reviewed  CBC WITH DIFFERENTIAL/PLATELET - Abnormal; Notable for the following components:      Result Value   WBC 11.9 (*)    Hemoglobin 11.6 (*)    HCT 35.9 (*)    MCH 25.8 (*)    Platelets 91 (*)    Neutro Abs 10.6 (*)    Lymphs Abs 0.5 (*)    Abs Immature Granulocytes 0.30 (*)    All other components within normal limits  BASIC  METABOLIC PANEL - Abnormal; Notable for the following components:   CO2 20 (*)    Glucose, Bld 177 (*)    BUN 40 (*)    Creatinine, Ser 3.61 (*)    GFR, Estimated 18 (*)    All other components within normal limits  LACTIC ACID, PLASMA - Abnormal; Notable for the following components:   Lactic Acid, Venous 3.3 (*)    All other components within normal limits  RESP PANEL BY RT-PCR (FLU A&B, COVID) [id number]                  EKG None  Radiology CT Renal Stone Study  Result Date: 12/09/2020 CLINICAL DATA:  Weakness, dizziness, left renal calculus seen on CT earlier today, urinary tract infection EXAM: CT ABDOMEN AND PELVIS WITHOUT CONTRAST TECHNIQUE: Multidetector CT imaging of the abdomen and pelvis was performed following the standard protocol without IV contrast. COMPARISON:  12/08/2020 at 3:29 p.m. FINDINGS: Lower chest: Trace left pleural effusion. No acute airspace disease. Hepatobiliary: Stable unenhanced appearance of the liver and gallbladder without focal abnormality. Pancreas: Unremarkable. No pancreatic ductal dilatation or surrounding inflammatory changes. Spleen: Stable mild splenomegaly. Adrenals/Urinary Tract: Stable 8 mm obstructing proximal left ureteral calculus, with moderate left hydronephrosis. Punctate less than 3 mm nonobstructing left renal calculi are also noted. Stable fat stranding in the left perinephric region and extending inferiorly in the left retroperitoneum. Unenhanced imaging of the right kidney is unremarkable. The adrenals and bladder are grossly normal. Stomach/Bowel: No bowel obstruction or ileus. Normal appendix right lower quadrant. No bowel wall thickening or inflammatory change. Vascular/Lymphatic: Aortic atherosclerosis. No enlarged abdominal or pelvic lymph nodes. Reproductive: Prostate is unremarkable. Other: No free intraperitoneal fluid or free gas. No abdominal wall hernia. Musculoskeletal: No acute or destructive bony lesions. Reconstructed  images demonstrate no additional findings. IMPRESSION: 1. Obstructing 8 mm proximal left ureteral calculus, with stable left-sided hydronephrosis. 2. Other punctate nonobstructing left renal calculi, stable. 3.  Aortic Atherosclerosis (ICD10-I70.0). 4. Trace left pleural effusion. Electronically Signed   By: Sharlet Salina M.D.   On: 12/09/2020 00:45        Procedures Procedures   CRITICAL CARE Performed by: Garlon Hatchet   Total critical care time: 45 minutes  Critical care time was exclusive of separately billable procedures and treating other patients.  Critical care was necessary to treat or prevent imminent or life-threatening deterioration.  Critical care was time spent personally by me on the following activities: development of treatment plan with patient and/or surrogate as well as nursing, discussions with  consultants, evaluation of patient's response to treatment, examination of patient, obtaining history from patient or surrogate, ordering and performing treatments and interventions, ordering and review of laboratory studies, ordering and review of radiographic studies, pulse oximetry and re-evaluation of patient's condition.   Medications Ordered in ED Medications - No data to display  ED Course  I have reviewed the triage vital signs and the nursing notes.  Pertinent labs & imaging results that were available during my care of the patient were reviewed by me and considered in my medical decision making (see chart for details).    MDM Rules/Calculators/A&P                          65 y.o. M here as a transfer from [facility] for intervention on obstructed/infected left ureteral stone.  Apparently, was at the hospital visiting his wife when he became diaphoretic and had a syncopal episode upstairs.  He was taken to the ER for further evaluation and was then found to have the stone.  Labs and CT findings as photographed above from [facility], he is  hemodynamically stable on arrival to ED and is currently denying any pain.  11:21 PM Spoke with Dr. Cardell Peach with urology, requested new labs for a baseline here as well as CT as no actual images available, only report.  He is trying to arrange OR schedule either tonight or this AM.  Will continue maintenance IVF and abx for now.  He requested medical admission.  Labs here with lactate of 3.3, however this is actually improved from earlier today.  WBC count 11.9.  SrCr still elevated at 3.6, ideally this will improve with stenting but will continue maintenance fluids for now.  covid screen negative.  CT with again confirmed left ureteral calculus, measuring 68mm with left hydronephrosis.    1:08 AM Spoke with Dr. Antionette Char-- will admit for ongoing care.  Aware of plan for OR with urology for likely stent.  Final Clinical Impression(s) / ED Diagnoses Final diagnoses:  Left ureteral stone  Acute renal failure, unspecified acute renal failure type [facility])    Rx / DC Orders ED Discharge Orders    None       Garlon Hatchet, PA-C 12/09/20 0134    Sabino Donovan, MD 12/09/20 1451

## 2020-12-08 NOTE — ED Triage Notes (Signed)
Patient BIB [facility] from [facility]. Patient was feeling weak, dizzy. He found out he has 65mm kidney stone in left kidney. After scans they found infection around the stone with blood work being done at [facility]. BP in the ED was 80s systolic. EMS BP 102 systolic. Patient does not complain of weakness or dizziness at this time.

## 2020-12-09 ENCOUNTER — Inpatient Hospital Stay (HOSPITAL_COMMUNITY): Payer: No Typology Code available for payment source | Admitting: Certified Registered"

## 2020-12-09 ENCOUNTER — Encounter (HOSPITAL_COMMUNITY)
Admission: EM | Disposition: A | Discharge status: Home / Self Care | Payer: Self-pay | Source: Home / Self Care | Attending: Internal Medicine

## 2020-12-09 ENCOUNTER — Encounter (HOSPITAL_COMMUNITY): Payer: Self-pay

## 2020-12-09 ENCOUNTER — Inpatient Hospital Stay (HOSPITAL_COMMUNITY): Payer: No Typology Code available for payment source

## 2020-12-09 ENCOUNTER — Emergency Department (HOSPITAL_COMMUNITY): Payer: No Typology Code available for payment source

## 2020-12-09 DIAGNOSIS — N135 Crossing vessel and stricture of ureter without hydronephrosis: Secondary | ICD-10-CM | POA: Diagnosis present

## 2020-12-09 DIAGNOSIS — I10 Essential (primary) hypertension: Secondary | ICD-10-CM | POA: Diagnosis present

## 2020-12-09 DIAGNOSIS — I2510 Atherosclerotic heart disease of native coronary artery without angina pectoris: Secondary | ICD-10-CM

## 2020-12-09 DIAGNOSIS — R6520 Severe sepsis without septic shock: Secondary | ICD-10-CM | POA: Diagnosis present

## 2020-12-09 DIAGNOSIS — N319 Neuromuscular dysfunction of bladder, unspecified: Secondary | ICD-10-CM | POA: Diagnosis present

## 2020-12-09 DIAGNOSIS — E119 Type 2 diabetes mellitus without complications: Secondary | ICD-10-CM | POA: Diagnosis present

## 2020-12-09 DIAGNOSIS — N179 Acute kidney failure, unspecified: Secondary | ICD-10-CM

## 2020-12-09 DIAGNOSIS — D696 Thrombocytopenia, unspecified: Secondary | ICD-10-CM

## 2020-12-09 DIAGNOSIS — Z20822 Contact with and (suspected) exposure to covid-19: Secondary | ICD-10-CM | POA: Diagnosis present

## 2020-12-09 DIAGNOSIS — I252 Old myocardial infarction: Secondary | ICD-10-CM | POA: Diagnosis not present

## 2020-12-09 DIAGNOSIS — E1159 Type 2 diabetes mellitus with other circulatory complications: Secondary | ICD-10-CM

## 2020-12-09 DIAGNOSIS — N400 Enlarged prostate without lower urinary tract symptoms: Secondary | ICD-10-CM | POA: Diagnosis present

## 2020-12-09 DIAGNOSIS — E785 Hyperlipidemia, unspecified: Secondary | ICD-10-CM | POA: Diagnosis present

## 2020-12-09 DIAGNOSIS — N202 Calculus of kidney with calculus of ureter: Secondary | ICD-10-CM | POA: Diagnosis present

## 2020-12-09 DIAGNOSIS — A419 Sepsis, unspecified organism: Secondary | ICD-10-CM | POA: Diagnosis present

## 2020-12-09 DIAGNOSIS — N136 Pyonephrosis: Secondary | ICD-10-CM | POA: Diagnosis present

## 2020-12-09 DIAGNOSIS — Z87891 Personal history of nicotine dependence: Secondary | ICD-10-CM | POA: Diagnosis not present

## 2020-12-09 DIAGNOSIS — Z96651 Presence of right artificial knee joint: Secondary | ICD-10-CM | POA: Diagnosis present

## 2020-12-09 DIAGNOSIS — Z79899 Other long term (current) drug therapy: Secondary | ICD-10-CM | POA: Diagnosis not present

## 2020-12-09 DIAGNOSIS — Z7982 Long term (current) use of aspirin: Secondary | ICD-10-CM | POA: Diagnosis not present

## 2020-12-09 DIAGNOSIS — N390 Urinary tract infection, site not specified: Secondary | ICD-10-CM | POA: Diagnosis present

## 2020-12-09 HISTORY — PX: CYSTOSCOPY W/ URETERAL STENT PLACEMENT: SHX1429

## 2020-12-09 LAB — BASIC METABOLIC PANEL
Anion gap: 11 (ref 5–15)
Anion gap: 14 (ref 5–15)
BUN: 40 mg/dL — ABNORMAL HIGH (ref 8–23)
BUN: 43 mg/dL — ABNORMAL HIGH (ref 8–23)
CO2: 20 mmol/L — ABNORMAL LOW (ref 22–32)
CO2: 21 mmol/L — ABNORMAL LOW (ref 22–32)
Calcium: 8.1 mg/dL — ABNORMAL LOW (ref 8.9–10.3)
Calcium: 8.9 mg/dL (ref 8.9–10.3)
Chloride: 101 mmol/L (ref 98–111)
Chloride: 102 mmol/L (ref 98–111)
Creatinine, Ser: 3.2 mg/dL — ABNORMAL HIGH (ref 0.61–1.24)
Creatinine, Ser: 3.61 mg/dL — ABNORMAL HIGH (ref 0.61–1.24)
GFR, Estimated: 18 mL/min — ABNORMAL LOW (ref >60)
GFR, Estimated: 21 mL/min — ABNORMAL LOW (ref >60)
Glucose, Bld: 141 mg/dL — ABNORMAL HIGH (ref 70–99)
Glucose, Bld: 177 mg/dL — ABNORMAL HIGH (ref 70–99)
Potassium: 4 mmol/L (ref 3.5–5.1)
Potassium: 4.4 mmol/L (ref 3.5–5.1)
Sodium: 134 mmol/L — ABNORMAL LOW (ref 135–145)
Sodium: 135 mmol/L (ref 135–145)

## 2020-12-09 LAB — URINALYSIS, COMPLETE (UACMP) WITH MICROSCOPIC
Bilirubin Urine: NEGATIVE
Glucose, UA: >=500 mg/dL — AB
Ketones, ur: NEGATIVE mg/dL
Nitrite: NEGATIVE
Protein, ur: 100 mg/dL — AB
RBC / HPF: >50 RBC/hpf — ABNORMAL HIGH (ref 0–5)
Specific Gravity, Urine: 1.008 (ref 1.005–1.030)
WBC, UA: >50 WBC/hpf — ABNORMAL HIGH (ref 0–5)
pH: 6 (ref 5.0–8.0)

## 2020-12-09 LAB — CBC WITH DIFFERENTIAL/PLATELET
Abs Immature Granulocytes: 0.3 10*3/uL — ABNORMAL HIGH (ref 0.00–0.07)
Basophils Absolute: 0 10*3/uL (ref 0.0–0.1)
Basophils Relative: 0 %
Eosinophils Absolute: 0 10*3/uL (ref 0.0–0.5)
Eosinophils Relative: 0 %
HCT: 35.9 % — ABNORMAL LOW (ref 39.0–52.0)
Hemoglobin: 11.6 g/dL — ABNORMAL LOW (ref 13.0–17.0)
Immature Granulocytes: 3 %
Lymphocytes Relative: 4 %
Lymphs Abs: 0.5 10*3/uL — ABNORMAL LOW (ref 0.7–4.0)
MCH: 25.8 pg — ABNORMAL LOW (ref 26.0–34.0)
MCHC: 32.3 g/dL (ref 30.0–36.0)
MCV: 80 fL (ref 80.0–100.0)
Monocytes Absolute: 0.4 10*3/uL (ref 0.1–1.0)
Monocytes Relative: 4 %
Neutro Abs: 10.6 10*3/uL — ABNORMAL HIGH (ref 1.7–7.7)
Neutrophils Relative %: 89 %
Platelets: 91 10*3/uL — ABNORMAL LOW (ref 150–400)
RBC: 4.49 MIL/uL (ref 4.22–5.81)
RDW: 15.4 % (ref 11.5–15.5)
WBC: 11.9 10*3/uL — ABNORMAL HIGH (ref 4.0–10.5)
nRBC: 0 % (ref 0.0–0.2)

## 2020-12-09 LAB — LACTIC ACID, PLASMA
Lactic Acid, Venous: 1.7 mmol/L (ref 0.5–1.9)
Lactic Acid, Venous: 3.3 mmol/L (ref 0.5–1.9)

## 2020-12-09 LAB — CBC
HCT: 32.4 % — ABNORMAL LOW (ref 39.0–52.0)
Hemoglobin: 10.2 g/dL — ABNORMAL LOW (ref 13.0–17.0)
MCH: 25.5 pg — ABNORMAL LOW (ref 26.0–34.0)
MCHC: 31.5 g/dL (ref 30.0–36.0)
MCV: 81 fL (ref 80.0–100.0)
Platelets: 73 10*3/uL — ABNORMAL LOW (ref 150–400)
RBC: 4 MIL/uL — ABNORMAL LOW (ref 4.22–5.81)
RDW: 15.4 % (ref 11.5–15.5)
WBC: 9.9 10*3/uL (ref 4.0–10.5)
nRBC: 0 % (ref 0.0–0.2)

## 2020-12-09 LAB — GLUCOSE, CAPILLARY
Glucose-Capillary: 130 mg/dL — ABNORMAL HIGH (ref 70–99)
Glucose-Capillary: 168 mg/dL — ABNORMAL HIGH (ref 70–99)
Glucose-Capillary: 164 mg/dL — ABNORMAL HIGH (ref 70–99)
Glucose-Capillary: 151 mg/dL — ABNORMAL HIGH (ref 70–99)
Glucose-Capillary: 138 mg/dL — ABNORMAL HIGH (ref 70–99)

## 2020-12-09 LAB — HIV ANTIBODY (ROUTINE TESTING W REFLEX): HIV Screen 4th Generation wRfx: NONREACTIVE

## 2020-12-09 LAB — MAGNESIUM: Magnesium: 1.3 mg/dL — ABNORMAL LOW (ref 1.7–2.4)

## 2020-12-09 LAB — RESP PANEL BY RT-PCR (FLU A&B, COVID) ARPGX2
Influenza A by PCR: NEGATIVE
Influenza B by PCR: NEGATIVE
SARS Coronavirus 2 by RT PCR: NEGATIVE

## 2020-12-09 SURGERY — CYSTOSCOPY, WITH RETROGRADE PYELOGRAM AND URETERAL STENT INSERTION
Anesthesia: General | Site: Ureter | Laterality: Left

## 2020-12-09 MED ORDER — PROPOFOL 10 MG/ML IV BOLUS
INTRAVENOUS | Status: DC | PRN
  Administered 2020-12-09: 100 mg via INTRAVENOUS

## 2020-12-09 MED ORDER — INSULIN ASPART 100 UNIT/ML IJ SOLN
0.0000 [IU] | Freq: Three times a day (TID) | INTRAMUSCULAR | Status: DC
  Administered 2020-12-09 (×3): 1 [IU] via SUBCUTANEOUS

## 2020-12-09 MED ORDER — INSULIN ASPART 100 UNIT/ML IJ SOLN
INTRAMUSCULAR | Status: AC
  Filled 2020-12-09: qty 1

## 2020-12-09 MED ORDER — ROCURONIUM BROMIDE 10 MG/ML (PF) SYRINGE
PREFILLED_SYRINGE | INTRAVENOUS | Status: DC | PRN
  Administered 2020-12-09: 10 mg via INTRAVENOUS

## 2020-12-09 MED ORDER — SODIUM CHLORIDE 0.9 % IR SOLN
Status: DC | PRN
  Administered 2020-12-09: 1000 mL

## 2020-12-09 MED ORDER — FENTANYL CITRATE (PF) 100 MCG/2ML IJ SOLN
INTRAMUSCULAR | Status: AC
  Filled 2020-12-09: qty 2

## 2020-12-09 MED ORDER — PHENYLEPHRINE HCL-NACL 10-0.9 MG/250ML-% IV SOLN
INTRAVENOUS | Status: DC | PRN
  Administered 2020-12-09: 50 ug/min via INTRAVENOUS

## 2020-12-09 MED ORDER — FENTANYL CITRATE (PF) 250 MCG/5ML IJ SOLN
INTRAMUSCULAR | Status: DC | PRN
  Administered 2020-12-09 (×2): 50 ug via INTRAVENOUS

## 2020-12-09 MED ORDER — OXYCODONE HCL 5 MG PO TABS: 5.0000 mg | ORAL_TABLET | Freq: Four times a day (QID) | ORAL | Status: DC | PRN

## 2020-12-09 MED ORDER — IOHEXOL 300 MG/ML  SOLN
INTRAMUSCULAR | Status: DC | PRN
  Administered 2020-12-09: 6 mL via URETHRAL

## 2020-12-09 MED ORDER — ONDANSETRON HCL 4 MG/2ML IJ SOLN: 4.0000 mg | Freq: Four times a day (QID) | INTRAMUSCULAR | Status: DC | PRN

## 2020-12-09 MED ORDER — ONDANSETRON HCL 4 MG/2ML IJ SOLN
INTRAMUSCULAR | Status: DC | PRN
  Administered 2020-12-09: 4 mg via INTRAVENOUS

## 2020-12-09 MED ORDER — ATORVASTATIN CALCIUM 40 MG PO TABS
80.0000 mg | ORAL_TABLET | Freq: Every day | ORAL | Status: DC
  Administered 2020-12-09: 80 mg via ORAL
  Filled 2020-12-09: qty 1
  Filled 2020-12-09: qty 2

## 2020-12-09 MED ORDER — SODIUM CHLORIDE 0.9 % IV SOLN: INTRAVENOUS | Status: AC

## 2020-12-09 MED ORDER — SUCCINYLCHOLINE CHLORIDE 200 MG/10ML IV SOSY
PREFILLED_SYRINGE | INTRAVENOUS | Status: DC | PRN
  Administered 2020-12-09: 190 mg via INTRAVENOUS

## 2020-12-09 MED ORDER — ACETAMINOPHEN 650 MG RE SUPP
650.0000 mg | Freq: Four times a day (QID) | RECTAL | Status: DC | PRN
  Filled 2020-12-09: qty 1

## 2020-12-09 MED ORDER — EPHEDRINE SULFATE-NACL 50-0.9 MG/10ML-% IV SOSY
PREFILLED_SYRINGE | INTRAVENOUS | Status: DC | PRN
  Administered 2020-12-09 (×3): 5 mg via INTRAVENOUS

## 2020-12-09 MED ORDER — ASPIRIN EC 81 MG PO TBEC
81.0000 mg | DELAYED_RELEASE_TABLET | Freq: Every day | ORAL | Status: DC
  Administered 2020-12-09 – 2020-12-10 (×2): 81 mg via ORAL
  Filled 2020-12-09 (×2): qty 1

## 2020-12-09 MED ORDER — MIDAZOLAM HCL 2 MG/2ML IJ SOLN
INTRAMUSCULAR | Status: AC
  Filled 2020-12-09: qty 2

## 2020-12-09 MED ORDER — HYDROMORPHONE HCL 1 MG/ML IJ SOLN
0.5000 mg | INTRAMUSCULAR | Status: DC | PRN
  Administered 2020-12-09: 0.5 mg via INTRAVENOUS
  Filled 2020-12-09: qty 0.5

## 2020-12-09 MED ORDER — MIDAZOLAM HCL 2 MG/2ML IJ SOLN
INTRAMUSCULAR | Status: DC | PRN
  Administered 2020-12-09 (×2): 1 mg via INTRAVENOUS

## 2020-12-09 MED ORDER — ONDANSETRON HCL 4 MG PO TABS
4.0000 mg | ORAL_TABLET | Freq: Four times a day (QID) | ORAL | Status: DC | PRN
  Filled 2020-12-09: qty 1

## 2020-12-09 MED ORDER — DEXAMETHASONE SODIUM PHOSPHATE 10 MG/ML IJ SOLN
INTRAMUSCULAR | Status: DC | PRN
  Administered 2020-12-09: 10 mg via INTRAVENOUS

## 2020-12-09 MED ORDER — LIDOCAINE 2% (20 MG/ML) 5 ML SYRINGE
INTRAMUSCULAR | Status: DC | PRN
  Administered 2020-12-09: 60 mg via INTRAVENOUS

## 2020-12-09 MED ORDER — PHENYLEPHRINE 40 MCG/ML (10ML) SYRINGE FOR IV PUSH (FOR BLOOD PRESSURE SUPPORT)
PREFILLED_SYRINGE | INTRAVENOUS | Status: DC | PRN
  Administered 2020-12-09 (×4): 120 ug via INTRAVENOUS

## 2020-12-09 MED ORDER — ACETAMINOPHEN 325 MG PO TABS: 650.0000 mg | ORAL_TABLET | Freq: Four times a day (QID) | ORAL | Status: DC | PRN

## 2020-12-09 MED ORDER — LACTATED RINGERS IV BOLUS (SEPSIS)
2000.0000 mL | Freq: Once | INTRAVENOUS | Status: AC
  Administered 2020-12-09: 1000 mL via INTRAVENOUS
  Administered 2020-12-09: 750 mL via INTRAVENOUS

## 2020-12-09 MED ORDER — FENTANYL CITRATE (PF) 100 MCG/2ML IJ SOLN: 25.0000 ug | INTRAMUSCULAR | Status: DC | PRN

## 2020-12-09 MED ORDER — SODIUM CHLORIDE 0.9 % IV SOLN
2.0000 g | INTRAVENOUS | Status: DC
  Administered 2020-12-09: 2 g via INTRAVENOUS
  Filled 2020-12-09: qty 2

## 2020-12-09 MED ORDER — LACTATED RINGERS IV SOLN: INTRAVENOUS | Status: DC | PRN

## 2020-12-09 SURGICAL SUPPLY — 19 items
BAG URO CATCHER STRL LF (MISCELLANEOUS) ×3 IMPLANT
CATH INTERMIT  6FR 70CM (CATHETERS) ×3 IMPLANT
CLOTH BEACON ORANGE TIMEOUT ST (SAFETY) ×3 IMPLANT
EXTRACTOR STONE NITINOL NGAGE (UROLOGICAL SUPPLIES) IMPLANT
GLOVE SURG ENC MOIS LTX SZ7.5 (GLOVE) ×3 IMPLANT
GLOVE SURG ENC MOIS LTX SZ8 (GLOVE) ×3 IMPLANT
GLOVE SURG ENC TEXT LTX SZ7 (GLOVE) ×3 IMPLANT
GLOVE SURG LTX SZ8 (GLOVE) ×3 IMPLANT
GOWN STRL REUS W/ TWL XL LVL3 (GOWN DISPOSABLE) ×2 IMPLANT
GOWN STRL REUS W/TWL LRG LVL3 (GOWN DISPOSABLE) ×3 IMPLANT
GOWN STRL REUS W/TWL XL LVL3 (GOWN DISPOSABLE) ×3
GUIDEWIRE ANG ZIPWIRE 038X150 (WIRE) ×3 IMPLANT
GUIDEWIRE STR DUAL SENSOR (WIRE) ×6 IMPLANT
KIT TURNOVER KIT A (KITS) ×3 IMPLANT
MANIFOLD NEPTUNE II (INSTRUMENTS) ×3 IMPLANT
PACK CYSTO (CUSTOM PROCEDURE TRAY) ×3 IMPLANT
STENT URET 6FRX26 CONTOUR (STENTS) ×3 IMPLANT
TUBING CONNECTING 10 (TUBING) ×3 IMPLANT
TUBING UROLOGY SET (TUBING) ×3 IMPLANT

## 2020-12-09 NOTE — Progress Notes (Signed)
Pt is in pre op pacu bay 10 Removed upper denture and ring that put in the bottle and placed in pants pocket.  alert orientedx4, cbg 130

## 2020-12-09 NOTE — Op Note (Signed)
Operative Note  Preoperative diagnosis:  1.  Left ureteral stone 2.  AKI 3.  UTI  Postoperative diagnosis: 1.  Left ureteral stone 2.  AKI 3.  UTI  Procedure(s): 1.  Cystoscopy 2. Left retrograde pyelogram with interpretation 3. Left ureteral stent placement 4. Fluoroscopy <1 hour with intraoperative interpretation  Surgeon: Jettie Pagan, MD  Assistants:  None  Anesthesia:  General  Complications:  None  EBL: Minimal  Specimens: 1.  ID Type Source Tests Collected by Time Destination  A : Left Renal Pelvis Urine Culture Urine Urine, Cystoscope URINE CULTURE Jannifer Hick, MD 12/09/2020 0255     Drains/Catheters: 1.  Left 6Fr x 26cm ureteral stent  Intraoperative findings:   1. Cystoscopy demonstrated no suspicious lesions, masses, stones or other pathology. 2. Left retrograde pyelogram demonstrated moderate left hydronephrosis. 3. Successful left ureteral stent placement with curl in the renal pelvis and bladder respectively.  Indication:  Ryan Stuart is a 65 y.o. male with 3-day history of left flank pain.  CT A/P 12/08/2020 demonstrated 11 mm left proximal ureteral stone with sign of obstruction, AKI and UTI.  After reviewing the management options for treatment, he elected to proceed with the above surgical procedure(s). We have discussed the potential benefits and risks of the procedure, side effects of the proposed treatment, the likelihood of the patient achieving the goals of the procedure, and any potential problems that might occur during the procedure or recuperation. Informed consent has been obtained.  Description of procedure: The patient was taken to the operating room and general anesthesia was induced.  The patient was placed in the dorsal lithotomy position, prepped and draped in the usual sterile fashion, and preoperative antibiotics were administered. A preoperative time-out was performed.   Cystourethroscopy was performed.  The patient's urethra was  examined and was normal. There was moderate bilobar prostatic hypertrophy. The bladder was then systematically examined in its entirety. There was no evidence for any bladder tumors, stones, or other mucosal pathology.    Attention then turned to the left ureteral orifice. A 0.038 zip wire was passed through the left orifice and over the wire a 5 Fr open ended catheter was inserted and passed up to the level of the renal pelvis. There was a left hydronephrotic drip and this was sent off as left renal pelvis urine for culture. Omnipaque contrast was injected through the ureteral catheter and a retrograde pyelogram was performed with findings as dictated above. The wire was then replaced and the open ended catheter was removed.   A 6Fr x 26cm ureteral stent was advance over the wire. The stent was positioned appropriately under fluoroscopic and cystoscopic guidance.  The wire was then removed with an adequate stent curl noted in the renal pelvis as well as in the bladder.  The bladder was then emptied and the procedure ended.  The patient appeared to tolerate the procedure well and without complications.  The patient was able to be awakened and transferred to the recovery unit in satisfactory condition.   Plan:  Admitted to medicine. Continue abx. F/u urine culture. Needs to be discharged on culture specific antibiotics. Trend creatinine. Will need definitive management of stone as outpatient. I have messaged schedulers to arrange.  Matt R. Hubbard Seldon MD [facility]  Pager: [phone]

## 2020-12-09 NOTE — H&P (Signed)
History and Physical    Ryan Stuart CWC:[id number] DOB: January 31, 1956 DOA: 12/08/2020  PCP: Hendricks Limes, PA-C   Patient coming from: Home   Chief Complaint: Left flank pain   HPI: Ryan Stuart is a 65 y.o. male with medical history significant for CAD, hypertension, diabetes mellitus, nephrolithiasis, and neurogenic bladder, who presented to the emergency department at [facility] on 12/08/2020 with left flank pain and lightheadedness.  Patient reports approximately 3 days of intermittent pain in the left flank that he was initially attributing to a recent fall.  He was visiting his wife at [facility] where he developed recurrent left flank pain, general malaise, and lightheadedness.  He was evaluated in the emergency department there where he was reported to have an obstructing stone at the left ureteropelvic junction, nitrite positive urine, and systolic blood pressure in the 80s.  Urology was consulted and he was transferred to [facility] ED.  Patient denies recent fevers or chills, denies dysuria or hematuria, and denies shortness of breath, cough, or chest pain.  COVID-19 screening test was negative at [facility].  [facility] ED Course: Upon arrival to the ED, patient is found to be afebrile, saturating well on room air, and with blood pressure 100/50.  Chemistry panel is notable for glucose 177 and creatinine 3.61.  CBC with platelets 91,000.  Lactic acid 3.3.  CT stone study demonstrates obstructing 8 mm proximal left ureteral calculus with left-sided hydronephrosis.  Urology evaluated the patient in the emergency department, and is planning to take him to the OR, and recommended medical admission.  Review of Systems:  All other systems reviewed and apart from HPI, are negative.  Past Medical History:  Diagnosis Date  . Diabetes mellitus without complication (HCC)   . Myocardial infarction (HCC) 08/22/2008  . Renal disorder     Past Surgical History:  Procedure Laterality Date   . REPLACEMENT TOTAL KNEE Right 2018    Social History:   reports that he quit smoking about 17 years ago. He has never used smokeless tobacco. He reports previous alcohol use. No history on file for drug use.  No Known Allergies  History reviewed. No pertinent family history.   Prior to Admission medications   Medication Sig Start Date End Date Taking? Authorizing Provider  aspirin 81 MG EC tablet Take 1 tablet by mouth daily. 12/31/19  Yes [provider]  atorvastatin (LIPITOR) 40 MG tablet Take 40 mg by mouth daily. 02/16/20  Yes [provider]  finasteride (PROSCAR) 5 MG tablet Take 5 mg by mouth daily. 03/16/20  Yes [provider]  atorvastatin (LIPITOR) 80 MG tablet Take 1 tablet (80 mg total) by mouth daily. 02/19/11 02/19/12  Herby Abraham, MD    Physical Exam: Vitals:   12/08/20 2353 12/09/20 0030 12/09/20 0154 12/09/20 0200  BP: 117/66 126/72 113/61 (!) 98/59  Pulse: 92 94  89  Resp: 20 (!) 24 20 19   Temp:   98.9 F (37.2 C)   TempSrc:      SpO2: 98% 95% 96% 97%  Weight:      Height:        Constitutional: NAD, calm  Eyes: PERTLA, lids and conjunctivae normal ENMT: Mucous membranes are moist. Posterior pharynx clear of any exudate or lesions.   Neck: supple, no masses  Respiratory: no wheezing, no crackles. No accessory muscle use.  Cardiovascular: S1 & S2 heard, regular rate and rhythm. No extremity edema.   Abdomen: No distension, no tenderness, soft.  Bowel sounds active.  Musculoskeletal: no clubbing / cyanosis. No joint deformity upper and lower extremities.   Skin: no significant rashes, lesions, ulcers. Warm, dry, well-perfused. Neurologic: CN 2-12 grossly intact. Sensation intact. Moving all extremities.  Psychiatric: Alert and oriented to person, place, and situation. Pleasant and cooperative.    Labs and Imaging on Admission: I have personally reviewed following labs and imaging studies  CBC: Recent Labs  Lab  12/08/20 2354  WBC 11.9*  NEUTROABS 10.6*  HGB 11.6*  HCT 35.9*  MCV 80.0  PLT 91*   Basic Metabolic Panel: Recent Labs  Lab 12/08/20 2354  NA 135  K 4.0  CL 101  CO2 20*  GLUCOSE 177*  BUN 40*  CREATININE 3.61*  CALCIUM 8.9   GFR: Estimated Creatinine Clearance: 22.4 mL/min (A) (by C-G formula based on SCr of 3.61 mg/dL (H)). Liver Function Tests: No results for input(s): AST, ALT, ALKPHOS, BILITOT, PROT, ALBUMIN in the last 168 hours. No results for input(s): LIPASE, AMYLASE in the last 168 hours. No results for input(s): AMMONIA in the last 168 hours. Coagulation Profile: No results for input(s): INR, PROTIME in the last 168 hours. Cardiac Enzymes: No results for input(s): CKTOTAL, CKMB, CKMBINDEX, TROPONINI in the last 168 hours. BNP (last 3 results) No results for input(s): PROBNP in the last 8760 hours. HbA1C: No results for input(s): HGBA1C in the last 72 hours. CBG: Recent Labs  Lab 12/09/20 0158  GLUCAP 130*   Lipid Profile: No results for input(s): CHOL, HDL, LDLCALC, TRIG, CHOLHDL, LDLDIRECT in the last 72 hours. Thyroid Function Tests: No results for input(s): TSH, T4TOTAL, FREET4, T3FREE, THYROIDAB in the last 72 hours. Anemia Panel: No results for input(s): VITAMINB12, FOLATE, FERRITIN, TIBC, IRON, RETICCTPCT in the last 72 hours. Urine analysis: No results found for: COLORURINE, APPEARANCEUR, LABSPEC, PHURINE, GLUCOSEU, HGBUR, BILIRUBINUR, KETONESUR, PROTEINUR, UROBILINOGEN, NITRITE, LEUKOCYTESUR Sepsis Labs: @LABRCNTIP (procalcitonin:4,lacticidven:4) ) Recent Results (from the past 240 hour(s))  Resp Panel by RT-PCR (Flu A&B, Covid) Nasopharyngeal Swab     Status: None   Collection Time: 12/08/20 11:54 PM   Specimen: Nasopharyngeal Swab; Nasopharyngeal(NP) swabs in vial transport medium  Result Value Ref Range Status   SARS Coronavirus 2 by RT PCR NEGATIVE NEGATIVE Final    Comment: (NOTE) SARS-CoV-2 target nucleic acids are NOT  DETECTED.  The SARS-CoV-2 RNA is generally detectable in upper respiratory specimens during the acute phase of infection. The lowest concentration of SARS-CoV-2 viral copies this assay can detect is 138 copies/mL. A negative result does not preclude SARS-Cov-2 infection and should not be used as the sole basis for treatment or other patient management decisions. A negative result may occur with  improper specimen collection/handling, submission of specimen other than nasopharyngeal swab, presence of viral mutation(s) within the areas targeted by this assay, and inadequate number of viral copies(<138 copies/mL). A negative result must be combined with clinical observations, patient history, and epidemiological information. The expected result is Negative.  Fact Sheet for Patients:  02/07/21  Fact Sheet for Healthcare Providers:  BloggerCourse.com  This test is no t yet approved or cleared by the [place] FDA and  has been authorized for detection and/or diagnosis of SARS-CoV-2 by FDA under an Emergency Use Authorization (EUA). This EUA will remain  in effect (meaning this test can be used) for the duration of the COVID-19 declaration under Section 564(b)(1) of the Act, 21 U.S.C.section 360bbb-3(b)(1), unless the authorization is terminated  or revoked sooner.       Influenza A  by PCR NEGATIVE NEGATIVE Final   Influenza B by PCR NEGATIVE NEGATIVE Final    Comment: (NOTE) The Xpert Xpress SARS-CoV-2/FLU/RSV plus assay is intended as an aid in the diagnosis of influenza from Nasopharyngeal swab specimens and should not be used as a sole basis for treatment. Nasal washings and aspirates are unacceptable for Xpert Xpress SARS-CoV-2/FLU/RSV testing.  Fact Sheet for Patients: BloggerCourse.com  Fact Sheet for Healthcare Providers: SeriousBroker.it  This test is not yet  approved or cleared by the [place] FDA and has been authorized for detection and/or diagnosis of SARS-CoV-2 by FDA under an Emergency Use Authorization (EUA). This EUA will remain in effect (meaning this test can be used) for the duration of the COVID-19 declaration under Section 564(b)(1) of the Act, 21 U.S.C. section 360bbb-3(b)(1), unless the authorization is terminated or revoked.  Performed at [facility], [address] [place], [place] [address]      Radiological Exams on Admission: CT Renal Stone Study  Result Date: 12/09/2020 CLINICAL DATA:  Weakness, dizziness, left renal calculus seen on CT earlier today, urinary tract infection EXAM: CT ABDOMEN AND PELVIS WITHOUT CONTRAST TECHNIQUE: Multidetector CT imaging of the abdomen and pelvis was performed following the standard protocol without IV contrast. COMPARISON:  12/08/2020 at 3:29 p.m. FINDINGS: Lower chest: Trace left pleural effusion. No acute airspace disease. Hepatobiliary: Stable unenhanced appearance of the liver and gallbladder without focal abnormality. Pancreas: Unremarkable. No pancreatic ductal dilatation or surrounding inflammatory changes. Spleen: Stable mild splenomegaly. Adrenals/Urinary Tract: Stable 8 mm obstructing proximal left ureteral calculus, with moderate left hydronephrosis. Punctate less than 3 mm nonobstructing left renal calculi are also noted. Stable fat stranding in the left perinephric region and extending inferiorly in the left retroperitoneum. Unenhanced imaging of the right kidney is unremarkable. The adrenals and bladder are grossly normal. Stomach/Bowel: No bowel obstruction or ileus. Normal appendix right lower quadrant. No bowel wall thickening or inflammatory change. Vascular/Lymphatic: Aortic atherosclerosis. No enlarged abdominal or pelvic lymph nodes. Reproductive: Prostate is unremarkable. Other: No free intraperitoneal fluid or free gas. No abdominal wall hernia.  Musculoskeletal: No acute or destructive bony lesions. Reconstructed images demonstrate no additional findings. IMPRESSION: 1. Obstructing 8 mm proximal left ureteral calculus, with stable left-sided hydronephrosis. 2. Other punctate nonobstructing left renal calculi, stable. 3.  Aortic Atherosclerosis (ICD10-I70.0). 4. Trace left pleural effusion. Electronically Signed   By: Sharlet Salina M.D.   On: 12/09/2020 00:45   Assessment/Plan   1. Obstructing left ureteral stone; UTI  - Presented to [facility] ED initially with left flank pain and lightheadedness and was found to have obstructing stone in left ureter with nitrite-positive UTI, hypotension, and elevated lactate  - He was transferred to [facility] where urology is planning to stent  - Rocephin was given in ED  - Continue Rocephin, fluid-resuscitate, trend lactate, follow culture and clinical course    2. Acute kidney injury  - SCr is 3.61 on admission; patient denies hx of CKD  - He was hypotensive in ED ureteral obstruction with hydronephrosis on left  - Fluid-resuscitate, hold antihypertensives, renally-dose medication, repeat serum chemistries in am    3. Thrombocytopenia  - Platelets 91k on admission, was normal in 2011  - Likely related to the acute infection, no bleeding noted   - Treat infection, monitor    4. CAD  - No anginal complaints  - Continue ASA and statin    5. Type II DM  - Check CBGs and use low-intensity SSI for now  6. Hypertension  - SBP was reportedly 80s in ED at [facility] and antihypertensives will be held initially    DVT prophylaxis: SCDs  Code Status: Full  Level of Care: Level of care: Progressive Family Communication: None available  Disposition Plan:  Patient is from: Home  Anticipated d/c is to: Home  Anticipated d/c date is: 12/12/20  Patient currently: Pending improvement in renal function  Consults called: Urology  Admission status: Inpatient     Briscoe Deutscherimothy S Nilsa Macht, MD [facility]  12/09/2020, 2:45 AM

## 2020-12-09 NOTE — Anesthesia Procedure Notes (Signed)
Procedure Name: Intubation Date/Time: 12/09/2020 2:45 AM Performed by: Minerva Ends, CRNA Pre-anesthesia Checklist: Patient identified, Emergency Drugs available, Suction available and Patient being monitored Patient Re-evaluated:Patient Re-evaluated prior to induction Oxygen Delivery Method: Circle System Utilized Preoxygenation: Pre-oxygenation with 100% oxygen Induction Type: IV induction, Cricoid Pressure applied and Rapid sequence Ventilation: Mask ventilation without difficulty Laryngoscope Size: Miller and 2 Tube type: Oral Number of attempts: 1 Airway Equipment and Method: Stylet Placement Confirmation: ETT inserted through vocal cords under direct vision,  positive ETCO2 and breath sounds checked- equal and bilateral Secured at: 22 cm Tube secured with: Tape Dental Injury: Teeth and Oropharynx as per pre-operative assessment  Comments: Smooth IV Induction [place]- intubation AM CRNA atraumatic teeth and mouth as preop bilat BS

## 2020-12-09 NOTE — Transfer of Care (Signed)
Immediate Anesthesia Transfer of Care Note  Patient: Ryan Stuart  Procedure(s) Performed: CYSTOSCOPY WITH RETROGRADE PYELOGRAM/URETERAL STENT PLACEMENT (Left Ureter)  Patient Location: PACU  Anesthesia Type:General  Level of Consciousness: awake and alert   Airway & Oxygen Therapy: Patient Spontanous Breathing and Patient connected to face mask oxygen  Post-op Assessment: Report given to RN and Post -op Vital signs reviewed and stable  Post vital signs: Reviewed and stable  Last Vitals:  Vitals Value Taken Time  BP 108/51 12/09/20 0320  Temp    Pulse 94 12/09/20 0321  Resp 14 12/09/20 0321  SpO2 96 % 12/09/20 0321  Vitals shown include unvalidated device data.  Last Pain:  Vitals:   12/09/20 0108  TempSrc:   PainSc: 2          Complications: No complications documented.

## 2020-12-09 NOTE — ED Notes (Signed)
With CT 

## 2020-12-09 NOTE — Plan of Care (Signed)
Care plan initiated.

## 2020-12-09 NOTE — Discharge Instructions (Signed)
   Activity:  You are encouraged to ambulate frequently (about every hour during waking hours) to help prevent blood clots from forming in your legs or lungs.     Diet: You should advance your diet as instructed by your physician.  It will be normal to have some bloating, nausea, and abdominal discomfort intermittently.   Prescriptions:  You will be provided a prescription for pain medication to take as needed.  If your pain is not severe enough to require the prescription pain medication, you may take extra strength Tylenol instead which will have less side effects.  You should also take a prescribed stool softener to avoid straining with bowel movements as the prescription pain medication may constipate you.   What to call us about: You should call the office [phone]) if you develop fever > 101 or develop persistent vomiting. Activity:  You are encouraged to ambulate frequently (about every hour during waking hours) to help prevent blood clots from forming in your legs or lungs.    You have a left ureteral stent in place.  This is temporary and will be removed/exchanged during definitive management of your ureteral stone.

## 2020-12-09 NOTE — Anesthesia Preprocedure Evaluation (Addendum)
Anesthesia Evaluation  Patient identified by MRN, date of birth, ID band Patient awake    Reviewed: Allergy & Precautions, NPO status , Patient's Chart, lab work & pertinent test results  Airway Mallampati: II  TM Distance: >3 FB Neck ROM: Full    Dental  (+) Edentulous Upper, Edentulous Lower, Dental Advisory Given   Pulmonary neg pulmonary ROS, former smoker,    Pulmonary exam normal breath sounds clear to auscultation       Cardiovascular hypertension, + CAD and + Past MI  Normal cardiovascular exam Rhythm:Regular Rate:Normal     Neuro/Psych PSYCHIATRIC DISORDERS Anxiety negative neurological ROS     GI/Hepatic negative GI ROS, Neg liver ROS,   Endo/Other  negative endocrine ROSdiabetes, Well Controlled, Type 2, Oral Hypoglycemic Agents  Renal/GU Renal InsufficiencyRenal disease (Cr 3.4, baseline 1, K 4.0)   neurogenic bladder, self caths  negative genitourinary   Musculoskeletal negative musculoskeletal ROS (+)   Abdominal   Peds  Hematology Thrombocytopenia plt 91   Anesthesia Other Findings stone at the left ureteropelvic junction with mild left hydronephrosis  Reproductive/Obstetrics                            Anesthesia Physical Anesthesia Plan  ASA: III and emergent  Anesthesia Plan: General   Post-op Pain Management:    Induction: Intravenous and Rapid sequence  PONV Risk Score and Plan: 2 and Midazolam, Dexamethasone and Ondansetron  Airway Management Planned: Oral ETT  Additional Equipment:   Intra-op Plan:   Post-operative Plan: Extubation in OR  Informed Consent: I have reviewed the patients History and Physical, chart, labs and discussed the procedure including the risks, benefits and alternatives for the proposed anesthesia with the patient or authorized representative who has indicated his/her understanding and acceptance.     Dental advisory given  Plan  Discussed with: CRNA  Anesthesia Plan Comments:         Anesthesia Quick Evaluation

## 2020-12-09 NOTE — Progress Notes (Signed)
Day of Surgery Subjective: Pain controlled. No nausea or emesis. Afebrile.  Objective: Vital signs in last 24 hours: Temp:  [98.2 F (36.8 C)-98.9 F (37.2 C)] 98.5 F (36.9 C) (06/03 0800) Pulse Rate:  [69-98] 69 (06/03 1200) Resp:  [11-26] 14 (06/03 1200) BP: (80-126)/(51-76) 97/63 (06/03 1200) SpO2:  [95 %-99 %] 98 % (06/03 1200) Weight:  [90.7 kg] 90.7 kg (06/02 2238)  Intake/Output from previous day: 06/02 0701 - 06/03 0700 In: 2650 [I.V.:600; IV Piggyback:2050] Out: 400 [Urine:400] Intake/Output this shift: Total I/O In: 120 [P.O.:120] Out: 775 [Urine:775]  Physical Exam:  General: Alert and oriented CV: RRR Lungs: Clear Abdomen: Soft, ND, NT Ext: NT, No erythema  Lab Results: Recent Labs    12/08/20 2354 12/09/20 0534  HGB 11.6* 10.2*  HCT 35.9* 32.4*   BMET Recent Labs    12/08/20 2354 12/09/20 0534  NA 135 134*  K 4.0 4.4  CL 101 102  CO2 20* 21*  GLUCOSE 177* 141*  BUN 40* 43*  CREATININE 3.61* 3.20*  CALCIUM 8.9 8.1*     Studies/Results: DG C-Arm 1-60 Min-No Report  Result Date: 12/09/2020 Fluoroscopy was utilized by the requesting physician.  No radiographic interpretation.   CT Renal Stone Study  Result Date: 12/09/2020 CLINICAL DATA:  Weakness, dizziness, left renal calculus seen on CT earlier today, urinary tract infection EXAM: CT ABDOMEN AND PELVIS WITHOUT CONTRAST TECHNIQUE: Multidetector CT imaging of the abdomen and pelvis was performed following the standard protocol without IV contrast. COMPARISON:  12/08/2020 at 3:29 p.m. FINDINGS: Lower chest: Trace left pleural effusion. No acute airspace disease. Hepatobiliary: Stable unenhanced appearance of the liver and gallbladder without focal abnormality. Pancreas: Unremarkable. No pancreatic ductal dilatation or surrounding inflammatory changes. Spleen: Stable mild splenomegaly. Adrenals/Urinary Tract: Stable 8 mm obstructing proximal left ureteral calculus, with moderate left  hydronephrosis. Punctate less than 3 mm nonobstructing left renal calculi are also noted. Stable fat stranding in the left perinephric region and extending inferiorly in the left retroperitoneum. Unenhanced imaging of the right kidney is unremarkable. The adrenals and bladder are grossly normal. Stomach/Bowel: No bowel obstruction or ileus. Normal appendix right lower quadrant. No bowel wall thickening or inflammatory change. Vascular/Lymphatic: Aortic atherosclerosis. No enlarged abdominal or pelvic lymph nodes. Reproductive: Prostate is unremarkable. Other: No free intraperitoneal fluid or free gas. No abdominal wall hernia. Musculoskeletal: No acute or destructive bony lesions. Reconstructed images demonstrate no additional findings. IMPRESSION: 1. Obstructing 8 mm proximal left ureteral calculus, with stable left-sided hydronephrosis. 2. Other punctate nonobstructing left renal calculi, stable. 3.  Aortic Atherosclerosis (ICD10-I70.0). 4. Trace left pleural effusion. Electronically Signed   By: Sharlet Salina M.D.   On: 12/09/2020 00:45    Assessment/Plan: 1. Left ureteral stone: CT A/P 12/08/2020 with 11 mm stone in the left ureteropelvic junction with mild left hydronephrosis.  Afebrile, AKI with creatinine 3.4 from baseline of 1, no leukocytosis, urinalysis with nitrite positive urine.  Also present is a punctate left upper pole or and about 2 mm left interpolar stone. S/p L RPG, L stent placement 12/09/2020 2. AKI: Creatinine 3.4 from baseline of 1 3. UTI: Urinalysis 12/08/2020 with nitrate positive urine, many bacteria present.  Urine culture pending. 4. Neurogenic bladder: Per report, he undergoes clean intermittent catheterization since 10/2020.  He is undergoing work-up at the [facility] hospital for further evaluation.  -Trend creatinine and followup urine culture -Needs to be discharged home on culture specific antibiotics -I have messaged schedulers to arrange for followup   LOS: 0  days   Ryan R. Mariko Nowakowski  MD 12/09/2020, 1:19 PM [facility]  Pager: [phone]

## 2020-12-09 NOTE — Progress Notes (Addendum)
PROGRESS NOTE    KATE SWEETMAN  PXT:[id number] DOB: 11-14-1955 DOA: 12/08/2020 PCP: Hendricks Limes, PA-C    Brief Narrative:  Mr. Robinette was admitted to the hospital with the working diagnosis of urinary tract infection in the setting of new obstructive uropathy, left ureteral stone. Severe sepsis present on admission (end-organ failure AKI).   65 year old male past medical history for coronary artery disease, hypertension, type 2 diabetes mellitus, nephrolithiasis and neurogenic bladder who presented to [facility] 6/2 with left flank pain and lightheadedness.  He reported 3 days of intermittent left flank pain.  He was diagnosed with obstructing stone in the left ureteropelvic junction.  His urine was positive for infection.  Patient was transferred to [facility] for further urologic evaluation.  At the time of his transfer his blood pressure was 100/50, heart rate 92, respiratory rate 24, oxygen saturation 96%, temperature 98.9.  His lungs are clear to auscultation bilaterally, heart S1-S2, present rhythmic, soft abdomen, no lower extremity edema.   Sodium 135, potassium 4.0, chloride 101, bicarb 20, glucose 177, BUN 40, creatinine 3.61, lactic acid 3.3, white count 11.9, hemoglobin 11.6, hematocrit 35.9, platelets 91.  Patient was placed on intravenous fluids along with intravenous antibiotic therapy.  Patient underwent cystoscopy, left retrograde pyelogram with interpretation, left ureteral stent placement on 6/3 with good toleration.  Assessment & Plan:   Principal Problem:   Ureteral obstruction Active Problems:   Type II diabetes mellitus (HCC)   Hypertension   CAD, NATIVE VESSEL   AKI (acute kidney injury) (HCC)   UTI (urinary tract infection)   Thrombocytopenia (HCC)   1. Sepsis due to urinary tract infection, complicated with left obstructive nephrolithiasis. Now sp stent placement.   Continue antibiotic therapy with ceftriaxone, follow up cultures, cell count and temperature  curve. Decrease IV fluids to 75 ml per H to prevent volume overload.   2. CAD. Dyslipidemia. Patient chest pain free., continue with aspirin and atorvastatin.   3. AKI hypomagnesemia renal function with serum cr at 3,20 with K at 3,3 and serum bicarbonate at 21. Mg 1,3 Continue IV fluids at lower rate, follow up renal function in am, avoid hypotension and nephrotoxic medications.   Continue Mg correction with mag sulfate 4 g.   4. Reactive thrombocytopenia. Continue close monitoring of cell count, no active bleeding.   5, T2DM. Continue glucose control and monitoring with insulin sliding scale.   Patient continue to be at high risk for worsening sepsis, renal failure and thrombocytopenia.   Status is: Inpatient  Remains inpatient appropriate because:IV treatments appropriate due to intensity of illness or inability to take PO   Dispo: The patient is from: Home              Anticipated d/c is to: Home              Patient currently is not medically stable to d/c.   Difficult to place patient No    DVT prophylaxis: Enoxaparin   Code Status:   full  Family Communication:  No family at the bedside     Consultants:   Urology     Procedures:   Left ureteral stent placement   Antimicrobials:   Ceftriaxone     Subjective: Patient is feeling better, back pain has improved, no nausea or vomiting and tolerating po well.   Objective: Vitals:   12/09/20 1100 12/09/20 1200 12/09/20 1327 12/09/20 1413  BP: 102/61 97/63 117/66 115/67  Pulse: 70 69 72 74  Resp: 17 14  16 19  Temp:   98.1 F (36.7 C) 97.9 F (36.6 C)  TempSrc:   Oral   SpO2: 98% 98% 100% 96%  Weight:      Height:        Intake/Output Summary (Last 24 hours) at 12/09/2020 1516 Last data filed at 12/09/2020 1130 Gross per 24 hour  Intake 2770 ml  Output 1175 ml  Net 1595 ml   Filed Weights   12/08/20 2238  Weight: 90.7 kg    Examination:   General: Not in pain or dyspnea,  Neurology: Awake and  alert, non focal  E ENT: mild pallor, no icterus, oral mucosa moist Cardiovascular: No JVD. S1-S2 present, rhythmic, no gallops, rubs, or murmurs. No lower extremity edema. Pulmonary: positive  breath sounds bilaterally, adequate air movement, no wheezing, rhonchi or rales. Gastrointestinal. Abdomen soft and non tender Skin. No rashes Musculoskeletal: no joint deformities     Data Reviewed: I have personally reviewed following labs and imaging studies  CBC: Recent Labs  Lab 12/08/20 2354 12/09/20 0534  WBC 11.9* 9.9  NEUTROABS 10.6*  --   HGB 11.6* 10.2*  HCT 35.9* 32.4*  MCV 80.0 81.0  PLT 91* 73*   Basic Metabolic Panel: Recent Labs  Lab 12/08/20 2354 12/09/20 0534  NA 135 134*  K 4.0 4.4  CL 101 102  CO2 20* 21*  GLUCOSE 177* 141*  BUN 40* 43*  CREATININE 3.61* 3.20*  CALCIUM 8.9 8.1*  MG  --  1.3*   GFR: Estimated Creatinine Clearance: 25.3 mL/min (A) (by C-G formula based on SCr of 3.2 mg/dL (H)). Liver Function Tests: No results for input(s): AST, ALT, ALKPHOS, BILITOT, PROT, ALBUMIN in the last 168 hours. No results for input(s): LIPASE, AMYLASE in the last 168 hours. No results for input(s): AMMONIA in the last 168 hours. Coagulation Profile: No results for input(s): INR, PROTIME in the last 168 hours. Cardiac Enzymes: No results for input(s): CKTOTAL, CKMB, CKMBINDEX, TROPONINI in the last 168 hours. BNP (last 3 results) No results for input(s): PROBNP in the last 8760 hours. HbA1C: No results for input(s): HGBA1C in the last 72 hours. CBG: Recent Labs  Lab 12/09/20 0158 12/09/20 0809 12/09/20 1113  GLUCAP 130* 168* 164*   Lipid Profile: No results for input(s): CHOL, HDL, LDLCALC, TRIG, CHOLHDL, LDLDIRECT in the last 72 hours. Thyroid Function Tests: No results for input(s): TSH, T4TOTAL, FREET4, T3FREE, THYROIDAB in the last 72 hours. Anemia Panel: No results for input(s): VITAMINB12, FOLATE, FERRITIN, TIBC, IRON, RETICCTPCT in the last 72  hours.    Radiology Studies: I have reviewed all of the imaging during this hospital visit personally     Scheduled Meds: . aspirin EC  81 mg Oral Daily  . atorvastatin  80 mg Oral q1800  . insulin aspart  0-6 Units Subcutaneous TID WC   Continuous Infusions: . sodium chloride 125 mL/hr at 12/09/20 1421  . cefTRIAXone (ROCEPHIN)  IV       LOS: 0 days        Wylodean Shimmel Annett Gula, MD

## 2020-12-09 NOTE — Progress Notes (Signed)
To OR

## 2020-12-09 NOTE — Anesthesia Postprocedure Evaluation (Signed)
Anesthesia Post Note  Patient: Ryan Stuart  Procedure(s) Performed: CYSTOSCOPY WITH RETROGRADE PYELOGRAM/URETERAL STENT PLACEMENT (Left Ureter)     Patient location during evaluation: PACU Anesthesia Type: General Level of consciousness: awake and alert Pain management: pain level controlled Vital Signs Assessment: post-procedure vital signs reviewed and stable Respiratory status: spontaneous breathing, nonlabored ventilation, respiratory function stable and patient connected to nasal cannula oxygen Cardiovascular status: blood pressure returned to baseline and stable Postop Assessment: no apparent nausea or vomiting Anesthetic complications: no   No complications documented.  Last Vitals:  Vitals:   12/09/20 1413 12/09/20 1836  BP: 115/67 120/70  Pulse: 74 66  Resp: 19 18  Temp: 36.6 C 36.6 C  SpO2: 96% 94%    Last Pain:  Vitals:   12/09/20 1327  TempSrc: Oral  PainSc:                  Collen Hostler L Seanpatrick Maisano

## 2020-12-09 NOTE — ED Notes (Signed)
Patient is performing self cath.

## 2020-12-09 NOTE — Consult Note (Addendum)
Urology Consult   Physician requesting consult: Cherlynn Perches, MD  Reason for consult: Left ureteral stone  History of Present Illness: Ryan Stuart is a 64 y.o. who presented to the ED tonight for 3-day history of intermittent left flank pain.  He initially thought this was due to attempting to catch his wife from a fall.  He was visiting his wife at [facility] today when he developed further pain and started to look "unwell."  He was then brought down to the emergency department where CT A/P was obtained that reportedly demonstrated an 11 mm stone at the left ureteropelvic junction with mild left hydronephrosis.  He was found to have an AKI with creatinine of 3.4 from a baseline of around 2 per report.  He denies fevers or chills.  He denies dysuria or gross hematuria.  He tells me that he has a long history of urolithiasis and has undergone multiple medical expulsive therapy, ESWL and ureteroscopy with laser lithotripsy.  He follows with urology at the [facility] hospital.  He also has a history of neurogenic bladder which is recently diagnosed and that he has been undergoing clean intermittent catheterizations for the past couple of months.  He tells me that he has an upcoming appointment later this month for TRUS biopsy and cystoscopy for potential bladder outlet obstruction procedure.  Past Medical History:  Diagnosis Date  . Diabetes mellitus without complication (HCC)   . Myocardial infarction (HCC) 08/22/2008  . Renal disorder     Past Surgical History:  Procedure Laterality Date  . REPLACEMENT TOTAL KNEE Right 2018    Medications:  Home meds:  No current facility-administered medications on file prior to encounter.   Current Outpatient Medications on File Prior to Encounter  Medication Sig Dispense Refill  . atorvastatin (LIPITOR) 80 MG tablet Take 1 tablet (80 mg total) by mouth daily. 80 tablet prn     Scheduled Meds: Continuous Infusions: . sodium chloride 1,000 mL  (12/08/20 2348)   PRN Meds:.  Allergies: No Known Allergies  History reviewed. No pertinent family history.  Social History:  reports that he quit smoking about 17 years ago. He has never used smokeless tobacco. He reports previous alcohol use. No history on file for drug use.  ROS: A complete review of systems was performed.  All systems are negative except for pertinent findings as noted.  Physical Exam:  Vital signs in last 24 hours: Temp:  [98.2 F (36.8 C)] 98.2 F (36.8 C) (06/02 2240) Pulse Rate:  [90-98] 92 (06/02 2353) Resp:  [18-20] 20 (06/02 2353) BP: (101-125)/(54-76) 117/66 (06/02 2353) SpO2:  [98 %] 98 % (06/02 2353) Weight:  [90.7 kg] 90.7 kg (06/02 2238) Constitutional:  Alert and oriented, No acute distress Cardiovascular: Regular rate and rhythm Respiratory: Normal respiratory effort, Lungs clear bilaterally GI: Abdomen is soft, nontender, nondistended, no abdominal masses Genitourinary: No CVAT Neurologic: Grossly intact, no focal deficits Psychiatric: Normal mood and affect  Laboratory Data:  No results for input(s): WBC, HGB, HCT, PLT in the last 72 hours.  Recent Labs    12/08/20 2354  NA 135  K 4.0  CL 101  GLUCOSE 177*  BUN 40*  CALCIUM 8.9  CREATININE 3.61*     Results for orders placed or performed during the hospital encounter of 12/08/20 (from the past 24 hour(s))  Basic metabolic panel     Status: Abnormal   Collection Time: 12/08/20 11:54 PM  Result Value Ref Range   Sodium 135 135 -  145 mmol/L   Potassium 4.0 3.5 - 5.1 mmol/L   Chloride 101 98 - 111 mmol/L   CO2 20 (L) 22 - 32 mmol/L   Glucose, Bld 177 (H) 70 - 99 mg/dL   BUN 40 (H) 8 - 23 mg/dL   Creatinine, Ser 2.83 (H) 0.61 - 1.24 mg/dL   Calcium 8.9 8.9 - 15.1 mg/dL   GFR, Estimated 18 (L) >60 mL/min   Anion gap 14 5 - 15  Lactic acid, plasma     Status: Abnormal   Collection Time: 12/08/20 11:54 PM  Result Value Ref Range   Lactic Acid, Venous 3.3 (HH) 0.5 - 1.9  mmol/L   No results found for this or any previous visit (from the past 240 hour(s)).  Renal Function: Recent Labs    12/08/20 2354  CREATININE 3.61*   Estimated Creatinine Clearance: 22.4 mL/min (A) (by C-G formula based on SCr of 3.61 mg/dL (H)).  Radiologic Imaging: No results found.  I independently reviewed the above imaging studies.  Impression/Recommendation 1. Left ureteral stone: CT A/P 12/08/2020 with 11 mm stone in the left ureteropelvic junction with mild left hydronephrosis.  Afebrile, AKI with creatinine 3.4 from baseline of 1, no leukocytosis, urinalysis with nitrite positive urine.  Also present is a punctate left upper pole or and about 2 mm left interpolar stone. 2. AKI: Creatinine 3.4 from baseline of 1 3. UTI: Urinalysis 12/08/2020 with nitrate positive urine, many bacteria present.  Urine culture pending. 4. Neurogenic bladder: Per report, he undergoes clean intermittent catheterization since 10/2020.  He is undergoing work-up at the [facility] for further evaluation.  -Recommend stent placement tonight given AKI and sign of UTI -Admit to medicine -Continue Rocephin -We will need definitive stone management as an outpatient.  [facility] indicated that he may want to follow-up with [facility] for definitive management of stone.  As such, I will arrange outpatient follow-up and schedule ureteroscopy with laser lithotripsy. --The risks, benefits and alternatives of cystoscopy with left JJ stent placement was discussed with the patient.  Risks include, but are not limited to: bleeding, urinary tract infection, ureteral injury, ureteral stricture disease, chronic pain, urinary symptoms, bladder injury, stent migration, the need for nephrostomy tube placement, MI, CVA, DVT, PE and the inherent risks with general anesthesia.  The patient voices understanding and wishes to proceed.   Matt R. Omara Alcon MD 12/09/2020, 12:38 AM  [facility]  Pager:  [phone]   CC: Cherlynn Perches, MD

## 2020-12-10 ENCOUNTER — Encounter (HOSPITAL_COMMUNITY): Payer: Self-pay | Admitting: Urology

## 2020-12-10 DIAGNOSIS — N390 Urinary tract infection, site not specified: Secondary | ICD-10-CM

## 2020-12-10 LAB — BASIC METABOLIC PANEL
Anion gap: 9 (ref 5–15)
BUN: 50 mg/dL — ABNORMAL HIGH (ref 8–23)
CO2: 22 mmol/L (ref 22–32)
Calcium: 8 mg/dL — ABNORMAL LOW (ref 8.9–10.3)
Chloride: 106 mmol/L (ref 98–111)
Creatinine, Ser: 2.8 mg/dL — ABNORMAL HIGH (ref 0.61–1.24)
GFR, Estimated: 24 mL/min — ABNORMAL LOW (ref >60)
Glucose, Bld: 126 mg/dL — ABNORMAL HIGH (ref 70–99)
Potassium: 4.5 mmol/L (ref 3.5–5.1)
Sodium: 137 mmol/L (ref 135–145)

## 2020-12-10 LAB — URINE CULTURE: Culture: <10000 — AB

## 2020-12-10 LAB — CBC
HCT: 31.7 % — ABNORMAL LOW (ref 39.0–52.0)
Hemoglobin: 10.1 g/dL — ABNORMAL LOW (ref 13.0–17.0)
MCH: 26 pg (ref 26.0–34.0)
MCHC: 31.9 g/dL (ref 30.0–36.0)
MCV: 81.5 fL (ref 80.0–100.0)
Platelets: 96 10*3/uL — ABNORMAL LOW (ref 150–400)
RBC: 3.89 MIL/uL — ABNORMAL LOW (ref 4.22–5.81)
RDW: 15.7 % — ABNORMAL HIGH (ref 11.5–15.5)
WBC: 13.2 10*3/uL — ABNORMAL HIGH (ref 4.0–10.5)
nRBC: 0 % (ref 0.0–0.2)

## 2020-12-10 LAB — GLUCOSE, CAPILLARY
Glucose-Capillary: 113 mg/dL — ABNORMAL HIGH (ref 70–99)
Glucose-Capillary: 110 mg/dL — ABNORMAL HIGH (ref 70–99)

## 2020-12-10 LAB — HEMOGLOBIN A1C
Hgb A1c MFr Bld: 7.2 % — ABNORMAL HIGH (ref 4.8–5.6)
Mean Plasma Glucose: 160 mg/dL

## 2020-12-10 MED ORDER — LISINOPRIL 20 MG PO TABS: 10.0000 mg | ORAL_TABLET | Freq: Every day | ORAL | Status: AC

## 2020-12-10 MED ORDER — OXYCODONE HCL 5 MG PO TABS: 5.0000 mg | ORAL_TABLET | Freq: Four times a day (QID) | ORAL | 0 refills | Status: DC | PRN

## 2020-12-10 MED ORDER — CEPHALEXIN 250 MG PO CAPS: 250.0000 mg | ORAL_CAPSULE | Freq: Two times a day (BID) | ORAL | 0 refills | Status: AC

## 2020-12-10 MED ORDER — ACETAMINOPHEN 325 MG PO TABS: 650.0000 mg | ORAL_TABLET | Freq: Four times a day (QID) | ORAL | Status: DC | PRN

## 2020-12-10 MED ORDER — CEPHALEXIN 250 MG PO CAPS: 250.0000 mg | ORAL_CAPSULE | Freq: Two times a day (BID) | ORAL | 0 refills | Status: DC

## 2020-12-10 MED ORDER — CEPHALEXIN 250 MG PO CAPS: 250.0000 mg | ORAL_CAPSULE | Freq: Two times a day (BID) | ORAL | Status: DC

## 2020-12-10 NOTE — Discharge Summary (Signed)
Physician Discharge Summary  Ryan Stuart:[id number] DOB: 1956-07-08 DOA: 12/08/2020  PCP: Hendricks Limes, PA-C  Admit date: 12/08/2020 Discharge date: 12/10/2020  Admitted From: Home  Disposition:  Home   Recommendations for Outpatient Follow-up and new medication changes:  1. Follow up with Urology as scheduled  2. Follow up with Hendricks Limes in 7 days.  3. Continue antibiotic therapy with Cephalexin 250 mg bid for 7 days.  4. Discontinue ibuprofen and holding on chlorthalidone, empaglifloxzin 5. Decrease lisinopril to 10 mg daily.  6. Follow up renal function as outpatient   Home Health: no   Equipment/Devices: no    Discharge Condition: stable  CODE STATUS: full  Diet recommendation:  Heart healthy   Brief/Interim Summary: Ryan Stuart was admitted to the hospital with the working diagnosis of urinary tract infection in the setting of new obstructive uropathy, left ureteral stone. Severe sepsis present on admission (end-organ failure AKI).   65 year old male past medical history for coronary artery disease, hypertension, type 2 diabetes mellitus, nephrolithiasis and neurogenic bladder who presented to [facility] 6/2 with left flank pain and lightheadedness.  He reported 3 days of intermittent left flank pain.  He was diagnosed with obstructing stone in the left ureteropelvic junction.  His urine was positive for infection.  Patient was transferred to [facility] for further urologic evaluation.  At the time of his transfer his blood pressure was 100/50, heart rate 92, respiratory rate 24, oxygen saturation 96%, temperature 98.9.  His lungs were clear to auscultation bilaterally, heart S1-S2, present rhythmic, soft abdomen, no lower extremity edema.   Sodium 135, potassium 4.0, chloride 101, bicarb 20, glucose 177, BUN 40, cre patient remained chest pain-free, continue aspirin and atorvastatin.  Atinine 3.61, lactic acid 3.3, white count 11.9, hemoglobin 11.6, hematocrit 35.9, platelets  91.  Patient was placed on intravenous fluids along with intravenous antibiotic therapy.  Patient underwent cystoscopy, left retrograde pyelogram with interpretation, left ureteral stent placement on 6/3 with good toleration.  Patient remained afebrile, his urine culture had no significant growth.  Plan to discharge home with oral antibiotic with oral cephalexin   1. Severe sepsis due to urinary tract infection, complicated with left obstructive nephrolithiasis.   Patient responded well to urologic procedure, he received IV antibiotic therapy with ceftriaxone along with intravenous fluids. He has remained afebrile, his discharge white cell count is 13.2, his urine culture has insignificant growth.  He has been changed to oral cephalexin, 250 mg twice daily, follow-up as an outpatient with urology.  2.  Acute kidney injury with suspected chronic kidney disease not clear baseline. Patient took about 2 g of ibuprofen before admission due to pain.    Patient received intravenous fluids along with supportive medical therapy, his kidney function has been improving, at discharge his creatinine is 2.8, sodium 137, potassium 4.7, chloride 106 bicarb 22. He is tolerating p.o. diet adequately, no nausea or vomiting.  Patient has been instructed to avoid nephrotoxic agents. Discontinue ibuprofen and decease dose of lisinopril. Holding on chlorthalidone.  Recommendations to follow-up kidney function as an outpatient.    3.  Coronary artery disease.  HTN. Dyslipidemia.  Patient has remained chest pain-free, continue aspirin and atorvastatin. Antihypertensive medications were held during his hospitalization, at discharge resume carvedilol and lower dose of lisinopril.  Hold on chlorthalidone for now.   4.  Reactive thrombocytopenia.  At his discharge platelet count improving up to 96.  Follow-up cell count as an outpatient.  5.  Type 2 diabetes mellitus.  Patient was placed on insulin sliding scale  for glucose coverage and monitoring.  Tolerating p.o. diet adequately. Due to decreased GFR, will hold on empagliflozin   Discharge Diagnoses:  Principal Problem:   Ureteral obstruction Active Problems:   Type II diabetes mellitus (HCC)   Hypertension   CAD, NATIVE VESSEL   AKI (acute kidney injury) (HCC)   UTI (urinary tract infection)   Thrombocytopenia (HCC)    Discharge Instructions   Allergies as of 12/10/2020   No Known Allergies     Medication List    STOP taking these medications   chlorthalidone 25 MG tablet Commonly known as: HYGROTON   empagliflozin 25 MG Tabs tablet Commonly known as: JARDIANCE   ibuprofen 200 MG tablet Commonly known as: ADVIL     TAKE these medications   acetaminophen 325 MG tablet Commonly known as: TYLENOL Take 2 tablets (650 mg total) by mouth every 6 (six) hours as needed for mild pain or moderate pain (or Fever >/= 101).   aspirin 81 MG EC tablet Take 81 mg by mouth daily.   atorvastatin 40 MG tablet Commonly known as: LIPITOR Take 40 mg by mouth at bedtime.   carvedilol 25 MG tablet Commonly known as: COREG Take 25 mg by mouth 2 (two) times daily.   cephALEXin 250 MG capsule Commonly known as: KEFLEX Take 1 capsule (250 mg total) by mouth every 12 (twelve) hours for 7 days.   docusate sodium 100 MG capsule Commonly known as: COLACE Take 100 mg by mouth 2 (two) times daily as needed for mild constipation.   finasteride 5 MG tablet Commonly known as: PROSCAR Take 5 mg by mouth daily.   Fish Oil 1000 MG Caps Take 2,000 mg by mouth 2 (two) times daily.   lisinopril 20 MG tablet Commonly known as: ZESTRIL Take 0.5 tablets (10 mg total) by mouth daily. What changed: how much to take   metFORMIN 500 MG tablet Commonly known as: GLUCOPHAGE Take 500 mg by mouth 2 (two) times daily.   oxyCODONE 5 MG immediate release tablet Commonly known as: Oxy IR/ROXICODONE Take 1 tablet (5 mg total) by mouth every 6 (six) hours  as needed for severe pain.   tadalafil 10 MG tablet Commonly known as: CIALIS Take 5 mg by mouth daily as needed for erectile dysfunction. Do not exceed 1 dose per 24 hours   tamsulosin 0.4 MG Caps capsule Commonly known as: FLOMAX Take 0.4 mg by mouth daily.       Follow-up Information    Call [facility].   Contact information: [address] [place] [id number] [phone]             No Known Allergies  Consultations:  Urology    Procedures/Studies: DG C-Arm 1-60 Min-No Report  Result Date: 12/09/2020 Fluoroscopy was utilized by the requesting physician.  No radiographic interpretation.   CT Renal Stone Study  Result Date: 12/09/2020 CLINICAL DATA:  Weakness, dizziness, left renal calculus seen on CT earlier today, urinary tract infection EXAM: CT ABDOMEN AND PELVIS WITHOUT CONTRAST TECHNIQUE: Multidetector CT imaging of the abdomen and pelvis was performed following the standard protocol without IV contrast. COMPARISON:  12/08/2020 at 3:29 p.m. FINDINGS: Lower chest: Trace left pleural effusion. No acute airspace disease. Hepatobiliary: Stable unenhanced appearance of the liver and gallbladder without focal abnormality. Pancreas: Unremarkable. No pancreatic ductal dilatation or surrounding inflammatory changes. Spleen: Stable mild splenomegaly. Adrenals/Urinary Tract: Stable 8 mm obstructing proximal left ureteral  calculus, with moderate left hydronephrosis. Punctate less than 3 mm nonobstructing left renal calculi are also noted. Stable fat stranding in the left perinephric region and extending inferiorly in the left retroperitoneum. Unenhanced imaging of the right kidney is unremarkable. The adrenals and bladder are grossly normal. Stomach/Bowel: No bowel obstruction or ileus. Normal appendix right lower quadrant. No bowel wall thickening or inflammatory change. Vascular/Lymphatic: Aortic atherosclerosis. No enlarged abdominal or pelvic  lymph nodes. Reproductive: Prostate is unremarkable. Other: No free intraperitoneal fluid or free gas. No abdominal wall hernia. Musculoskeletal: No acute or destructive bony lesions. Reconstructed images demonstrate no additional findings. IMPRESSION: 1. Obstructing 8 mm proximal left ureteral calculus, with stable left-sided hydronephrosis. 2. Other punctate nonobstructing left renal calculi, stable. 3.  Aortic Atherosclerosis (ICD10-I70.0). 4. Trace left pleural effusion. Electronically Signed   By: Sharlet Salina M.D.   On: 12/09/2020 00:45      Procedures:  1.  Cystoscopy 2. Left retrograde pyelogram with interpretation 3. Left ureteral stent placement 4. Fluoroscopy <1 hour with intraoperative interpretation   Subjective: Patient feeling better, no nausea or vomiting, no chest pain or dyspnea, dysuria is improving, no hematuria.   Discharge Exam: Vitals:   12/10/20 0242 12/10/20 0550  BP: 134/76 132/67  Pulse: 62 (!) 54  Resp:  18  Temp: 97.7 F (36.5 C) 98 F (36.7 C)  SpO2: 95% 93%   Vitals:   12/09/20 1836 12/09/20 2045 12/10/20 0242 12/10/20 0550  BP: 120/70 (!) 117/51 134/76 132/67  Pulse: 66 61 62 (!) 54  Resp: 18 18  18   Temp: 97.8 F (36.6 C) 98 F (36.7 C) 97.7 F (36.5 C) 98 F (36.7 C)  TempSrc:   Oral Oral  SpO2: 94% 95% 95% 93%  Weight:      Height:        General: Not in pain or dyspnea,  Neurology: Awake and alert, non focal  E ENT: no pallor, no icterus, oral mucosa moist Cardiovascular: No JVD. S1-S2 present, rhythmic, no gallops, rubs, or murmurs. No lower extremity edema. Pulmonary: positive breath sounds bilaterally, adequate air movement, no wheezing, rhonchi or rales. Gastrointestinal. Abdomen soft and non tender Skin. No rashes Musculoskeletal: no joint deformities   The results of significant diagnostics from this hospitalization (including imaging, microbiology, ancillary and laboratory) are listed below for reference.      Microbiology: Recent Results (from the past 240 hour(s))  Resp Panel by RT-PCR (Flu A&B, Covid) Nasopharyngeal Swab     Status: None   Collection Time: 12/08/20 11:54 PM   Specimen: Nasopharyngeal Swab; Nasopharyngeal(NP) swabs in vial transport medium  Result Value Ref Range Status   SARS Coronavirus 2 by RT PCR NEGATIVE NEGATIVE Final    Comment: (NOTE) SARS-CoV-2 target nucleic acids are NOT DETECTED.  The SARS-CoV-2 RNA is generally detectable in upper respiratory specimens during the acute phase of infection. The lowest concentration of SARS-CoV-2 viral copies this assay can detect is 138 copies/mL. A negative result does not preclude SARS-Cov-2 infection and should not be used as the sole basis for treatment or other patient management decisions. A negative result may occur with  improper specimen collection/handling, submission of specimen other than nasopharyngeal swab, presence of viral mutation(s) within the areas targeted by this assay, and inadequate number of viral copies(<138 copies/mL). A negative result must be combined with clinical observations, patient history, and epidemiological information. The expected result is Negative.  Fact Sheet for Patients:  02/07/21  Fact Sheet for Healthcare Providers:  BloggerCourse.com  This test is no t yet approved or cleared by the [place] and  has been authorized for detection and/or diagnosis of SARS-CoV-2 by FDA under an Emergency Use Authorization (EUA). This EUA will remain  in effect (meaning this test can be used) for the duration of the COVID-19 declaration under Section 564(b)(1) of the Act, 21 U.S.C.section 360bbb-3(b)(1), unless the authorization is terminated  or revoked sooner.       Influenza A by PCR NEGATIVE NEGATIVE Final   Influenza B by PCR NEGATIVE NEGATIVE Final    Comment: (NOTE) The Xpert Xpress SARS-CoV-2/FLU/RSV plus assay is  intended as an aid in the diagnosis of influenza from Nasopharyngeal swab specimens and should not be used as a sole basis for treatment. Nasal washings and aspirates are unacceptable for Xpert Xpress SARS-CoV-2/FLU/RSV testing.  Fact Sheet for Patients: BloggerCourse.com  Fact Sheet for Healthcare Providers: SeriousBroker.it  This test is not yet approved or cleared by the [place] FDA and has been authorized for detection and/or diagnosis of SARS-CoV-2 by FDA under an Emergency Use Authorization (EUA). This EUA will remain in effect (meaning this test can be used) for the duration of the COVID-19 declaration under Section 564(b)(1) of the Act, 21 U.S.C. section 360bbb-3(b)(1), unless the authorization is terminated or revoked.  Performed at [facility], [address], [place], [place] [address]   Urine culture     Status: Abnormal (Preliminary result)   Collection Time: 12/09/20  2:24 AM   Specimen: Cystoscopy; Urine  Result Value Ref Range Status   Specimen Description   Final    CYSTOSCOPY Performed at [facility], [address], [place], [place] [address]    Special Requests   Final    NONE Performed at [facility], [address] [place], [place] [address]    Culture (A)  Final    20,000 COLONIES/mL PANTOEA AGGLOMERANS SUSCEPTIBILITIES TO FOLLOW Performed at [facility], [address], [place], [place] [address]    Report Status PENDING  Incomplete  Urine Culture     Status: Abnormal   Collection Time: 12/09/20  3:03 AM   Specimen: Urine, Cystoscope  Result Value Ref Range Status   Specimen Description   Final    URINE, CLEAN CATCH Performed at [facility], [address], [place], [place] [address]    Special Requests   Final    NONE Performed at [facility], [address] [place], [place]  [address]    Culture (A)  Final    <10,000 COLONIES/mL INSIGNIFICANT GROWTH Performed at [facility], [address] [place], [place] [address]    Report Status 12/10/2020 FINAL  Final     Labs: BNP (last 3 results) No results for input(s): BNP in the last 8760 hours. Basic Metabolic Panel: Recent Labs  Lab 12/08/20 2354 12/09/20 0534 12/10/20 0517  NA 135 134* 137  K 4.0 4.4 4.5  CL 101 102 106  CO2 20* 21* 22  GLUCOSE 177* 141* 126*  BUN 40* 43* 50*  CREATININE 3.61* 3.20* 2.80*  CALCIUM 8.9 8.1* 8.0*  MG  --  1.3*  --    Liver Function Tests: No results for input(s): AST, ALT, ALKPHOS, BILITOT, PROT, ALBUMIN in the last 168 hours. No results for input(s): LIPASE, AMYLASE in the last 168 hours. No results for input(s): AMMONIA in the last 168 hours. CBC: Recent Labs  Lab 12/08/20 2354 12/09/20  0534 12/10/20 0517  WBC 11.9* 9.9 13.2*  NEUTROABS 10.6*  --   --   HGB 11.6* 10.2* 10.1*  HCT 35.9* 32.4* 31.7*  MCV 80.0 81.0 81.5  PLT 91* 73* 96*   Cardiac Enzymes: No results for input(s): CKTOTAL, CKMB, CKMBINDEX, TROPONINI in the last 168 hours. BNP: Invalid input(s): POCBNP CBG: Recent Labs  Lab 12/09/20 1113 12/09/20 1720 12/09/20 2040 12/10/20 0734 12/10/20 1130  GLUCAP 164* 151* 138* 113* 110*   D-Dimer No results for input(s): DDIMER in the last 72 hours. Hgb A1c Recent Labs    12/09/20 0534  HGBA1C 7.2*   Lipid Profile No results for input(s): CHOL, HDL, LDLCALC, TRIG, CHOLHDL, LDLDIRECT in the last 72 hours. Thyroid function studies No results for input(s): TSH, T4TOTAL, T3FREE, THYROIDAB in the last 72 hours.  Invalid input(s): FREET3 Anemia work up No results for input(s): VITAMINB12, FOLATE, FERRITIN, TIBC, IRON, RETICCTPCT in the last 72 hours. Urinalysis    Component Value Date/Time   COLORURINE RED (A) 12/09/2020 0224   APPEARANCEUR CLOUDY (A) 12/09/2020 0224   LABSPEC 1.008 12/09/2020 0224   PHURINE 6.0 12/09/2020 0224    GLUCOSEU >=500 (A) 12/09/2020 0224   HGBUR LARGE (A) 12/09/2020 0224   BILIRUBINUR NEGATIVE 12/09/2020 0224   KETONESUR NEGATIVE 12/09/2020 0224   PROTEINUR 100 (A) 12/09/2020 0224   NITRITE NEGATIVE 12/09/2020 0224   LEUKOCYTESUR LARGE (A) 12/09/2020 0224   Sepsis Labs Invalid input(s): PROCALCITONIN,  WBC,  LACTICIDVEN Microbiology Recent Results (from the past 240 hour(s))  Resp Panel by RT-PCR (Flu A&B, Covid) Nasopharyngeal Swab     Status: None   Collection Time: 12/08/20 11:54 PM   Specimen: Nasopharyngeal Swab; Nasopharyngeal(NP) swabs in vial transport medium  Result Value Ref Range Status   SARS Coronavirus 2 by RT PCR NEGATIVE NEGATIVE Final    Comment: (NOTE) SARS-CoV-2 target nucleic acids are NOT DETECTED.  The SARS-CoV-2 RNA is generally detectable in upper respiratory specimens during the acute phase of infection. The lowest concentration of SARS-CoV-2 viral copies this assay can detect is 138 copies/mL. A negative result does not preclude SARS-Cov-2 infection and should not be used as the sole basis for treatment or other patient management decisions. A negative result may occur with  improper specimen collection/handling, submission of specimen other than nasopharyngeal swab, presence of viral mutation(s) within the areas targeted by this assay, and inadequate number of viral copies(<138 copies/mL). A negative result must be combined with clinical observations, patient history, and epidemiological information. The expected result is Negative.  Fact Sheet for Patients:  BloggerCourse.com  Fact Sheet for Healthcare Providers:  SeriousBroker.it  This test is no t yet approved or cleared by the [place] FDA and  has been authorized for detection and/or diagnosis of SARS-CoV-2 by FDA under an Emergency Use Authorization (EUA). This EUA will remain  in effect (meaning this test can be used) for the duration  of the COVID-19 declaration under Section 564(b)(1) of the Act, 21 U.S.C.section 360bbb-3(b)(1), unless the authorization is terminated  or revoked sooner.       Influenza A by PCR NEGATIVE NEGATIVE Final   Influenza B by PCR NEGATIVE NEGATIVE Final    Comment: (NOTE) The Xpert Xpress SARS-CoV-2/FLU/RSV plus assay is intended as an aid in the diagnosis of influenza from Nasopharyngeal swab specimens and should not be used as a sole basis for treatment. Nasal washings and aspirates are unacceptable for Xpert Xpress SARS-CoV-2/FLU/RSV testing.  Fact Sheet for Patients: BloggerCourse.com  Fact Sheet for  Healthcare Providers: SeriousBroker.it  This test is not yet approved or cleared by the [place] and has been authorized for detection and/or diagnosis of SARS-CoV-2 by FDA under an Emergency Use Authorization (EUA). This EUA will remain in effect (meaning this test can be used) for the duration of the COVID-19 declaration under Section 564(b)(1) of the Act, 21 U.S.C. section 360bbb-3(b)(1), unless the authorization is terminated or revoked.  Performed at [facility], [address] [place], [place] [address]   Urine culture     Status: Abnormal (Preliminary result)   Collection Time: 12/09/20  2:24 AM   Specimen: Cystoscopy; Urine  Result Value Ref Range Status   Specimen Description   Final    CYSTOSCOPY Performed at [facility], [address] [place], [place] [address]    Special Requests   Final    NONE Performed at [facility], [address], [place], [place] [address]    Culture (A)  Final    20,000 COLONIES/mL PANTOEA AGGLOMERANS SUSCEPTIBILITIES TO FOLLOW Performed at [facility], [address] [place], [place] [address]    Report Status PENDING  Incomplete  Urine Culture     Status: Abnormal   Collection Time: 12/09/20  3:03 AM    Specimen: Urine, Cystoscope  Result Value Ref Range Status   Specimen Description   Final    URINE, CLEAN CATCH Performed at [facility], [address], [place], [place] [address]    Special Requests   Final    NONE Performed at [facility], [address] [place], [place] [address]    Culture (A)  Final    <10,000 COLONIES/mL INSIGNIFICANT GROWTH Performed at [facility], [address] [place], [place] [address]    Report Status 12/10/2020 FINAL  Final     Time coordinating discharge: 45 minutes  SIGNED:   Coralie Keens, MD  [facility] 12/10/2020, 12:58 PM

## 2020-12-11 LAB — URINE CULTURE: Culture: 20000 — AB

## 2020-12-16 ENCOUNTER — Other Ambulatory Visit: Payer: Self-pay | Admitting: Urology

## 2020-12-27 NOTE — Progress Notes (Addendum)
COVID Vaccine Completed:  Yes x4 Date COVID Vaccine completed: Has received booster: Yes x2. Second booster 12/2020 COVID vaccine manufacturer: Pfizer       COVID positive in last 90 days: No   PCP - Hendricks Limes, PA-C.  [facility] Cardiologist - Valera Castle, MD  Chest x-ray -12-08-20 on chart from [facility] EKG - 12-08-20 on chart from [facility] Stress Test - greater than 5 years ECHO - Unsure Cardiac Cath - 2011 Pacemaker/ICD device last checked: Spinal Cord Stimulator:  Sleep Study - N/A CPAP -   Fasting Blood Sugar - 94 to 110 Checks Blood Sugar - once a week  Blood Thinner Instructions: Aspirin Instructions:  ASA 81 mg  Last Dose:  12-26-20  Activity level:  Can go up a flight of stairs and perform activities of daily living without stopping and without symptoms of chest pain or shortness of breath. Only limitation is joint pain    Anesthesia review:  Hx of MI, CAD, HTN, DM  Pulse 50 at PAT appointment.  Patient unsure if this is his normal rate.  Denies dizziness or other symptoms.  Patient denies shortness of breath, fever, cough and chest pain at PAT appointment   Patient verbalized understanding of instructions that were given to them at the PAT appointment. Patient was also instructed that they will need to review over the PAT instructions again at home before surgery.

## 2020-12-27 NOTE — Patient Instructions (Addendum)
DUE TO COVID-19 ONLY ONE VISITOR IS ALLOWED TO COME WITH YOU AND STAY IN THE WAITING ROOM ONLY DURING PRE OP AND PROCEDURE.   **NO VISITORS ARE ALLOWED IN THE SHORT STAY AREA OR RECOVERY ROOM!!**         Your procedure is scheduled on:  Monday, 01-02-21   Report to [facility] Main  Entrance   Report to admitting at 1:30 PM   Call this number if you have problems the morning of surgery [phone]   Do not eat food :After Midnight.   May have liquids until 12:30 PM  day of surgery  CLEAR LIQUID DIET  Foods Allowed                                                                     Foods Excluded  Water, Black Coffee and tea, regular and decaf              liquids that you cannot  Plain Jell-O in any flavor  (No red)                                    see through such as: Fruit ices (not with fruit pulp)                                      milk, soups, orange juice              Iced Popsicles (No red)                                      All solid food                                   Apple juices Sports drinks like Gatorade (No red) Lightly seasoned clear broth or consume(fat free) Sugar, honey syrup     Oral Hygiene is also important to reduce your risk of infection.                                    Remember - BRUSH YOUR TEETH THE MORNING OF SURGERY WITH YOUR REGULAR TOOTHPASTE   Do NOT smoke after Midnight   Take these medicines the morning of surgery with A SIP OF WATER:  Carvediolol, Finasteride, Tamsulosin  How to Manage Your Diabetes Before and After Surgery  Why is it important to control my blood sugar before and after surgery? Improving blood sugar levels before and after surgery helps healing and can limit problems. A way of improving blood sugar control is eating a healthy diet by:  Eating less sugar and carbohydrates  Increasing activity/exercise  Talking with your doctor about reaching your blood sugar goals High blood sugars (greater than 180  mg/dL) can raise your risk of infections and slow your recovery, so you will need to focus on controlling your diabetes  during the weeks before surgery. Make sure that the doctor who takes care of your diabetes knows about your planned surgery including the date and location.  How do I manage my blood sugar before surgery? Check your blood sugar at least 4 times a day, starting 2 days before surgery, to make sure that the level is not too high or low. Check your blood sugar the morning of your surgery when you wake up and every 2 hours until you get to the Short Stay unit. If your blood sugar is less than 70 mg/dL, you will need to treat for low blood sugar: Do not take insulin. Treat a low blood sugar (less than 70 mg/dL) with  cup of clear juice (cranberry or apple), 4 glucose tablets, OR glucose gel. Recheck blood sugar in 15 minutes after treatment (to make sure it is greater than 70 mg/dL). If your blood sugar is not greater than 70 mg/dL on recheck, call [phone] for further instructions. Report your blood sugar to the short stay nurse when you get to Short Stay.  If you are admitted to the hospital after surgery: Your blood sugar will be checked by the staff and you will probably be given insulin after surgery (instead of oral diabetes medicines) to make sure you have good blood sugar levels. The goal for blood sugar control after surgery is 80-180 mg/dL.   WHAT DO I DO ABOUT MY DIABETES MEDICATION?  Do not take oral diabetes medicines (pills) the morning of surgery.  THE DAY BEFORE SURGERY:   Take Metformin as prescribed.       THE MORNING OF SURGERY:  Do not take  Metformin.    Reviewed and Endorsed by [facility] Patient Education Committee, August 2015                                You may not have any metal on your body including hair pins, jewelry, and body piercing             Do not wear lotions, powders, cologne, or deodorant              Men may shave face and  neck.   Do not bring valuables to the hospital. [employer] IS NOT RESPONSIBLE   FOR VALUABLES.   Contacts, dentures or bridgework may not be worn into surgery.   Patients discharged the day of surgery will not be allowed to drive home.   Please read over the following fact sheets you were given: IF YOU HAVE QUESTIONS ABOUT YOUR PRE OP INSTRUCTIONS PLEASE CALL [phone] [facility] - Preparing for Surgery Before surgery, you can play an important role.  Because skin is not sterile, your skin needs to be as free of germs as possible.  You can reduce the number of germs on your skin by washing with CHG (chlorahexidine gluconate) soap before surgery.  CHG is an antiseptic cleaner which kills germs and bonds with the skin to continue killing germs even after washing. Please DO NOT use if you have an allergy to CHG or antibacterial soaps.  If your skin becomes reddened/irritated stop using the CHG and inform your nurse when you arrive at Short Stay. Do not shave (including legs and underarms) for at least 48 hours prior to the first CHG shower.  You may shave your face/neck.  Please follow these instructions carefully:  1.  Shower with CHG  Soap the night before surgery and the  morning of surgery.  2.  If you choose to wash your hair, wash your hair first as usual with your normal  shampoo.  3.  After you shampoo, rinse your hair and body thoroughly to remove the shampoo.                             4.  Use CHG as you would any other liquid soap.  You can apply chg directly to the skin and wash.  Gently with a scrungie or clean washcloth.  5.  Apply the CHG Soap to your body ONLY FROM THE NECK DOWN.   Do   not use on face/ open                           Wound or open sores. Avoid contact with eyes, ears mouth and   genitals (private parts).                       Wash face,  Genitals (private parts) with your normal soap.             6.  Wash thoroughly, paying special attention to the  area where your    surgery  will be performed.  7.  Thoroughly rinse your body with warm water from the neck down.  8.  DO NOT shower/wash with your normal soap after using and rinsing off the CHG Soap.                9.  Pat yourself dry with a clean towel.            10.  Wear clean pajamas.            11.  Place clean sheets on your bed the night of your first shower and do not  sleep with pets. Day of Surgery : Do not apply any lotions/deodorants the morning of surgery.  Please wear clean clothes to the hospital/surgery center.  FAILURE TO FOLLOW THESE INSTRUCTIONS MAY RESULT IN THE CANCELLATION OF YOUR SURGERY  PATIENT SIGNATURE_________________________________  NURSE SIGNATURE__________________________________  ________________________________________________________________________

## 2020-12-29 ENCOUNTER — Encounter (HOSPITAL_COMMUNITY): Payer: Self-pay

## 2020-12-29 ENCOUNTER — Other Ambulatory Visit: Payer: Self-pay

## 2020-12-29 ENCOUNTER — Encounter (HOSPITAL_COMMUNITY)
Admission: RE | Admit: 2020-12-29 | Discharge: 2020-12-29 | Disposition: A | Discharge status: Home / Self Care | Payer: No Typology Code available for payment source | Source: Ambulatory Visit | Attending: Urology | Admitting: Urology

## 2020-12-29 DIAGNOSIS — Z01818 Encounter for other preprocedural examination: Secondary | ICD-10-CM | POA: Diagnosis not present

## 2020-12-29 HISTORY — DX: Essential (primary) hypertension: I10

## 2020-12-29 HISTORY — DX: Personal history of urinary calculi: Z87.442

## 2020-12-29 HISTORY — DX: Unspecified osteoarthritis, unspecified site: M19.90

## 2020-12-29 HISTORY — DX: Atherosclerotic heart disease of native coronary artery without angina pectoris: I25.10

## 2020-12-29 LAB — BASIC METABOLIC PANEL
Anion gap: 6 (ref 5–15)
BUN: 15 mg/dL (ref 8–23)
CO2: 26 mmol/L (ref 22–32)
Calcium: 8.7 mg/dL — ABNORMAL LOW (ref 8.9–10.3)
Chloride: 104 mmol/L (ref 98–111)
Creatinine, Ser: 1.42 mg/dL — ABNORMAL HIGH (ref 0.61–1.24)
GFR, Estimated: 54 mL/min — ABNORMAL LOW (ref >60)
Glucose, Bld: 171 mg/dL — ABNORMAL HIGH (ref 70–99)
Potassium: 4.2 mmol/L (ref 3.5–5.1)
Sodium: 136 mmol/L (ref 135–145)

## 2020-12-29 LAB — CBC
HCT: 34.5 % — ABNORMAL LOW (ref 39.0–52.0)
Hemoglobin: 10.6 g/dL — ABNORMAL LOW (ref 13.0–17.0)
MCH: 25.1 pg — ABNORMAL LOW (ref 26.0–34.0)
MCHC: 30.7 g/dL (ref 30.0–36.0)
MCV: 81.6 fL (ref 80.0–100.0)
Platelets: 131 10*3/uL — ABNORMAL LOW (ref 150–400)
RBC: 4.23 MIL/uL (ref 4.22–5.81)
RDW: 15 % (ref 11.5–15.5)
WBC: 3.9 10*3/uL — ABNORMAL LOW (ref 4.0–10.5)
nRBC: 0 % (ref 0.0–0.2)

## 2020-12-29 LAB — GLUCOSE, CAPILLARY: Glucose-Capillary: 190 mg/dL — ABNORMAL HIGH (ref 70–99)

## 2021-01-02 ENCOUNTER — Ambulatory Visit (HOSPITAL_COMMUNITY): Payer: No Typology Code available for payment source

## 2021-01-02 ENCOUNTER — Ambulatory Visit (HOSPITAL_COMMUNITY): Payer: No Typology Code available for payment source | Admitting: Physician Assistant

## 2021-01-02 ENCOUNTER — Encounter (HOSPITAL_COMMUNITY)
Admission: RE | Disposition: A | Discharge status: Home / Self Care | Payer: Self-pay | Source: Home / Self Care | Attending: Urology

## 2021-01-02 ENCOUNTER — Ambulatory Visit (HOSPITAL_COMMUNITY)
Admission: RE | Admit: 2021-01-02 | Discharge: 2021-01-02 | Disposition: A | Discharge status: Home / Self Care | Payer: No Typology Code available for payment source | Attending: Urology | Admitting: Urology

## 2021-01-02 ENCOUNTER — Encounter (HOSPITAL_COMMUNITY): Payer: Self-pay | Admitting: Urology

## 2021-01-02 DIAGNOSIS — N200 Calculus of kidney: Secondary | ICD-10-CM | POA: Insufficient documentation

## 2021-01-02 DIAGNOSIS — Z96651 Presence of right artificial knee joint: Secondary | ICD-10-CM | POA: Insufficient documentation

## 2021-01-02 DIAGNOSIS — N319 Neuromuscular dysfunction of bladder, unspecified: Secondary | ICD-10-CM | POA: Insufficient documentation

## 2021-01-02 DIAGNOSIS — Z87891 Personal history of nicotine dependence: Secondary | ICD-10-CM | POA: Diagnosis not present

## 2021-01-02 DIAGNOSIS — Z955 Presence of coronary angioplasty implant and graft: Secondary | ICD-10-CM | POA: Insufficient documentation

## 2021-01-02 HISTORY — PX: CYSTOSCOPY/URETEROSCOPY/HOLMIUM LASER/STENT PLACEMENT: SHX6546

## 2021-01-02 LAB — GLUCOSE, CAPILLARY
Glucose-Capillary: 97 mg/dL (ref 70–99)
Glucose-Capillary: 99 mg/dL (ref 70–99)

## 2021-01-02 SURGERY — CYSTOSCOPY/URETEROSCOPY/HOLMIUM LASER/STENT PLACEMENT
Anesthesia: General | Laterality: Left

## 2021-01-02 MED ORDER — LACTATED RINGERS IV SOLN: INTRAVENOUS | Status: DC

## 2021-01-02 MED ORDER — SODIUM CHLORIDE 0.9 % IR SOLN
Status: DC | PRN
  Administered 2021-01-02: 3000 mL

## 2021-01-02 MED ORDER — AMISULPRIDE (ANTIEMETIC) 5 MG/2ML IV SOLN: 5.0000 mg | Freq: Once | INTRAVENOUS | Status: DC | PRN

## 2021-01-02 MED ORDER — CEFAZOLIN SODIUM-DEXTROSE 2-4 GM/100ML-% IV SOLN
2.0000 g | INTRAVENOUS | Status: AC
  Administered 2021-01-02: 2 g via INTRAVENOUS
  Filled 2021-01-02: qty 100

## 2021-01-02 MED ORDER — FENTANYL CITRATE (PF) 100 MCG/2ML IJ SOLN
INTRAMUSCULAR | Status: DC | PRN
  Administered 2021-01-02: 25 ug via INTRAVENOUS

## 2021-01-02 MED ORDER — DOCUSATE SODIUM 100 MG PO CAPS: 100.0000 mg | ORAL_CAPSULE | Freq: Every day | ORAL | 0 refills | Status: AC | PRN

## 2021-01-02 MED ORDER — ONDANSETRON HCL 4 MG/2ML IJ SOLN
INTRAMUSCULAR | Status: AC
  Filled 2021-01-02: qty 2

## 2021-01-02 MED ORDER — HYDRALAZINE HCL 20 MG/ML IJ SOLN
INTRAMUSCULAR | Status: AC
  Filled 2021-01-02: qty 1

## 2021-01-02 MED ORDER — ONDANSETRON HCL 4 MG/2ML IJ SOLN
INTRAMUSCULAR | Status: DC | PRN
  Administered 2021-01-02: 4 mg via INTRAVENOUS

## 2021-01-02 MED ORDER — PROPOFOL 10 MG/ML IV BOLUS
INTRAVENOUS | Status: DC | PRN
  Administered 2021-01-02: 200 mg via INTRAVENOUS
  Administered 2021-01-02: 40 mg via INTRAVENOUS

## 2021-01-02 MED ORDER — ACETAMINOPHEN 10 MG/ML IV SOLN
INTRAVENOUS | Status: AC
  Filled 2021-01-02: qty 100

## 2021-01-02 MED ORDER — DIATRIZOATE MEGLUMINE 30 % UR SOLN
URETHRAL | Status: AC
  Filled 2021-01-02: qty 100

## 2021-01-02 MED ORDER — FENTANYL CITRATE (PF) 100 MCG/2ML IJ SOLN
INTRAMUSCULAR | Status: AC
  Filled 2021-01-02: qty 2

## 2021-01-02 MED ORDER — PHENYLEPHRINE 40 MCG/ML (10ML) SYRINGE FOR IV PUSH (FOR BLOOD PRESSURE SUPPORT)
PREFILLED_SYRINGE | INTRAVENOUS | Status: AC
  Filled 2021-01-02: qty 10

## 2021-01-02 MED ORDER — FENTANYL CITRATE (PF) 100 MCG/2ML IJ SOLN
25.0000 ug | INTRAMUSCULAR | Status: DC | PRN
  Administered 2021-01-02: 50 ug via INTRAVENOUS

## 2021-01-02 MED ORDER — DEXAMETHASONE SODIUM PHOSPHATE 10 MG/ML IJ SOLN
INTRAMUSCULAR | Status: AC
  Filled 2021-01-02: qty 1

## 2021-01-02 MED ORDER — CHLORHEXIDINE GLUCONATE 0.12 % MT SOLN
15.0000 mL | Freq: Once | OROMUCOSAL | Status: AC
  Administered 2021-01-02: 15 mL via OROMUCOSAL

## 2021-01-02 MED ORDER — EPHEDRINE 5 MG/ML INJ
INTRAVENOUS | Status: AC
  Filled 2021-01-02: qty 10

## 2021-01-02 MED ORDER — LIDOCAINE 2% (20 MG/ML) 5 ML SYRINGE
INTRAMUSCULAR | Status: DC | PRN
  Administered 2021-01-02: 60 mg via INTRAVENOUS
  Administered 2021-01-02: 40 mg via INTRAVENOUS

## 2021-01-02 MED ORDER — ONDANSETRON HCL 4 MG/2ML IJ SOLN: 4.0000 mg | Freq: Once | INTRAMUSCULAR | Status: DC | PRN

## 2021-01-02 MED ORDER — EPHEDRINE SULFATE-NACL 50-0.9 MG/10ML-% IV SOSY
PREFILLED_SYRINGE | INTRAVENOUS | Status: DC | PRN
  Administered 2021-01-02 (×2): 10 mg via INTRAVENOUS

## 2021-01-02 MED ORDER — ACETAMINOPHEN 10 MG/ML IV SOLN
1000.0000 mg | Freq: Once | INTRAVENOUS | Status: DC | PRN
  Administered 2021-01-02: 1000 mg via INTRAVENOUS

## 2021-01-02 MED ORDER — DEXAMETHASONE SODIUM PHOSPHATE 10 MG/ML IJ SOLN
INTRAMUSCULAR | Status: DC | PRN
  Administered 2021-01-02: 4 mg via INTRAVENOUS

## 2021-01-02 MED ORDER — KETOROLAC TROMETHAMINE 15 MG/ML IJ SOLN: 15.0000 mg | Freq: Once | INTRAMUSCULAR | Status: DC | PRN

## 2021-01-02 MED ORDER — PROPOFOL 10 MG/ML IV BOLUS
INTRAVENOUS | Status: AC
  Filled 2021-01-02: qty 40

## 2021-01-02 MED ORDER — HYDRALAZINE HCL 20 MG/ML IJ SOLN
5.0000 mg | Freq: Once | INTRAMUSCULAR | Status: AC
  Administered 2021-01-02: 5 mg via INTRAVENOUS

## 2021-01-02 MED ORDER — CEPHALEXIN 500 MG PO CAPS: 500.0000 mg | ORAL_CAPSULE | Freq: Two times a day (BID) | ORAL | 0 refills | Status: AC

## 2021-01-02 MED ORDER — OXYCODONE HCL 5 MG PO TABS: 5.0000 mg | ORAL_TABLET | Freq: Four times a day (QID) | ORAL | 0 refills | Status: AC | PRN

## 2021-01-02 MED ORDER — ORAL CARE MOUTH RINSE: 15.0000 mL | Freq: Once | OROMUCOSAL | Status: AC

## 2021-01-02 MED ORDER — LIDOCAINE 2% (20 MG/ML) 5 ML SYRINGE
INTRAMUSCULAR | Status: AC
  Filled 2021-01-02: qty 5

## 2021-01-02 MED ORDER — 0.9 % SODIUM CHLORIDE (POUR BTL) OPTIME
TOPICAL | Status: DC | PRN
  Administered 2021-01-02: 1000 mL

## 2021-01-02 MED ORDER — IOHEXOL 300 MG/ML  SOLN
INTRAMUSCULAR | Status: DC | PRN
  Administered 2021-01-02: 10 mL

## 2021-01-02 SURGICAL SUPPLY — 23 items
BAG URO CATCHER STRL LF (MISCELLANEOUS) ×3 IMPLANT
BASKET ZERO TIP NITINOL 2.4FR (BASKET) ×3 IMPLANT
CATH URET 5FR 28IN OPEN ENDED (CATHETERS) ×3 IMPLANT
CATH URET DUAL LUMEN 6-10FR 50 (CATHETERS) ×3 IMPLANT
CLOTH BEACON ORANGE TIMEOUT ST (SAFETY) ×3 IMPLANT
FIBER LASER MOSES 200 DFL (Laser) ×3 IMPLANT
GLOVE SURG ENC TEXT LTX SZ7 (GLOVE) ×3 IMPLANT
GOWN STRL REUS W/TWL LRG LVL3 (GOWN DISPOSABLE) ×3 IMPLANT
GUIDEWIRE STR DUAL SENSOR (WIRE) ×6 IMPLANT
GUIDEWIRE ZIPWRE .038 STRAIGHT (WIRE) IMPLANT
IV NS 1000ML (IV SOLUTION) ×3
IV NS 1000ML BAXH (IV SOLUTION) ×1 IMPLANT
KIT TURNOVER KIT A (KITS) ×3 IMPLANT
LASER FIB FLEXIVA PULSE ID 365 (Laser) IMPLANT
MANIFOLD NEPTUNE II (INSTRUMENTS) ×3 IMPLANT
PACK CYSTO (CUSTOM PROCEDURE TRAY) ×3 IMPLANT
SHEATH URETERAL 12FRX35CM (MISCELLANEOUS) ×3 IMPLANT
STENT URET 6FRX26 CONTOUR (STENTS) ×3 IMPLANT
TRACTIP FLEXIVA PULS ID 200XHI (Laser) IMPLANT
TRACTIP FLEXIVA PULSE ID 200 (Laser)
TUBING CONNECTING 10 (TUBING) ×2 IMPLANT
TUBING CONNECTING 10' (TUBING) ×1
TUBING UROLOGY SET (TUBING) ×3 IMPLANT

## 2021-01-02 NOTE — Anesthesia Procedure Notes (Signed)
Procedure Name: LMA Insertion Date/Time: 01/02/2021 3:35 PM Performed by: Ezekiel Ina, CRNA Pre-anesthesia Checklist: Patient identified, Emergency Drugs available, Suction available and Patient being monitored Patient Re-evaluated:Patient Re-evaluated prior to induction Oxygen Delivery Method: Circle System Utilized Preoxygenation: Pre-oxygenation with 100% oxygen Induction Type: IV induction Ventilation: Mask ventilation without difficulty LMA: LMA inserted LMA Size: 5.0 Number of attempts: 1 Airway Equipment and Method: Bite block Placement Confirmation: positive ETCO2 Tube secured with: Tape Dental Injury: Teeth and Oropharynx as per pre-operative assessment

## 2021-01-02 NOTE — Transfer of Care (Signed)
Immediate Anesthesia Transfer of Care Note  Patient: Ryan Stuart  Procedure(s) Performed: CYSTOSCOPY/RETROGRADE/URETEROSCOPY/HOLMIUM LASER/STENT PLACEMENT (Left)  Patient Location: PACU  Anesthesia Type:General  Level of Consciousness: drowsy  Airway & Oxygen Therapy: Patient Spontanous Breathing and Patient connected to face mask oxygen  Post-op Assessment: Report given to RN and Post -op Vital signs reviewed and stable  Post vital signs: Reviewed and stable  Last Vitals:  Vitals Value Taken Time  BP 169/85 01/02/21 1647  Temp 36.4 C 01/02/21 1645  Pulse 44 01/02/21 1650  Resp 10 01/02/21 1650  SpO2 100 % 01/02/21 1650  Vitals shown include unvalidated device data.  Last Pain:  Vitals:   01/02/21 1645  TempSrc:   PainSc: 0-No pain      Patients Stated Pain Goal: 1 (01/02/21 1334)  Complications: No notable events documented.

## 2021-01-02 NOTE — Anesthesia Preprocedure Evaluation (Addendum)
Anesthesia Evaluation  Patient identified by MRN, date of birth, ID band Patient awake    Reviewed: Allergy & Precautions, NPO status , Patient's Chart, lab work & pertinent test results  Airway Mallampati: I  TM Distance: >3 FB Neck ROM: Full    Dental  (+) Edentulous Upper, Edentulous Lower   Pulmonary former smoker,    Pulmonary exam normal breath sounds clear to auscultation       Cardiovascular hypertension, Pt. on home beta blockers and Pt. on medications + CAD, + Past MI and + Cardiac Stents (x 3)   Rhythm:Regular Rate:Bradycardia     Neuro/Psych Anxiety negative neurological ROS     GI/Hepatic negative GI ROS, Neg liver ROS,   Endo/Other  diabetes, Oral Hypoglycemic Agents  Renal/GU Renal disease     Musculoskeletal  (+) Arthritis ,   Abdominal   Peds  Hematology  (+) anemia , HLD   Anesthesia Other Findings LEFT URETERAL STONE  Reproductive/Obstetrics                            Anesthesia Physical Anesthesia Plan  ASA: 3  Anesthesia Plan: General   Post-op Pain Management:    Induction: Intravenous  PONV Risk Score and Plan: 3 and Ondansetron, Dexamethasone, Midazolam and Treatment may vary due to age or medical condition  Airway Management Planned: LMA  Additional Equipment:   Intra-op Plan:   Post-operative Plan: Extubation in OR  Informed Consent: I have reviewed the patients History and Physical, chart, labs and discussed the procedure including the risks, benefits and alternatives for the proposed anesthesia with the patient or authorized representative who has indicated his/her understanding and acceptance.       Plan Discussed with: CRNA  Anesthesia Plan Comments:         Anesthesia Quick Evaluation

## 2021-01-02 NOTE — Discharge Instructions (Signed)

## 2021-01-02 NOTE — Op Note (Signed)
Operative Note  Preoperative diagnosis:  1.  Left renal stone  Postoperative diagnosis: 1.  Left renal stone  Procedure(s): 1.  Cystoscopy 2. Left ureteroscopy with laser lithotripsy and basket extraction of stones 3. Left retrograde pyelogram 4. Left ureteral stent exchange 5. Fluoroscopy with intraoperative interpretation  Surgeon: Jettie Pagan, MD  Assistants:  None  Anesthesia:  General  Complications:  None  EBL:  Minimal  Specimens: 1. Left renal stones for stone analysis (to be done at [facility])  Drains/Catheters: 1.  Left 6Fr x 26cm ureteral stent WITHOUT a tether string  Intraoperative findings:   Cystoscopy demonstrated no suspicious bladder lesions. Mildly obstructing prostate. Left ureteroscopy demonstrated no ureteral stones. He had a large renal stone that was fragmented and basket extracted. Left retrograde pyelogram demonstrated no filling defects. Successful left ureteral stent placement.  Indication:  Ryan Stuart is a 65 y.o. male with a history of a left UPJ stone. He presented to the ED on 12/09/2020 where CT A/P demonstrated 11 mm stone at the left UPJ with mild left hydronephrosis. He was found to have an AKI with creatinine 3.4 from a baseline of 2. He underwent left ureteral stent placement on 12/09/2020. He is seen today for definitive treatment of his stone.  Description of procedure: After informed consent was obtained from the patient, the patient was identified and taken to the operating room and placed in the supine position.  General anesthesia was administered as well as perioperative IV antibiotics.  At the beginning of the case, a time-out was performed to properly identify the patient, the surgery to be performed, and the surgical site.  Sequential compression devices were applied to the lower extremities at the beginning of the case for DVT prophylaxis.  The patient was then placed in the dorsal lithotomy supine position, prepped and  draped in sterile fashion.  Preliminary scout fluoroscopy revealed that there was a 74mm calcification area at the left lower pole, which corresponds to the stone found on the preoperative CT scan. We then passed the 21-French rigid cystoscope through the urethra and into the bladder under vision without any difficulty, noting a normal urethra without strictures and a mildly obstructing prostate.  A systematic evaluation of the bladder revealed no evidence of any suspicious bladder lesions.  Ureteral orifices were in normal position.    The distal aspect of the ureteral stent was seen protruding from the left ureteral orifice.  We then used the alligator-tooth forceps and grasped the distal end of the ureteral stent and brought it out the urethral meatus while watching the proximal coil straighten out nicely on fluoroscopy. Through the ureteral stent, we then passed a 0.038 sensor wire up to the level of the renal pelvis.  The ureteral stent was then removed, leaving the sensor wire up the left ureter.    A semi-rigid ureteroscope was passed alongside the wire up the distal ureter which appeared normal. A second 0.038 sensor wire was passed under direct vision and the semirigid scope was removed. A 12/14 Fr ureteral access sheath was carefully advanced up the ureter to the level of the UPJ over this wire under fluoroscopic guidance. The flexible ureteroscope was advanced into the collecting system via the access sheath. The collecting system was inspected. The calculus was identified at the left lower pole. Using the 200 micron holmium laser fiber, the stone was fragmented completely. A 2.2 Fr zero tip basket was used to remove the fragments under visual guidance. These were sent for  chemical analysis. With the ureteroscope in the kidney, a gentle pyelogram was performed to delineate the calyceal system and we evaluated the calyces systematically. We encountered a no further large stone fragments. The rest of  the stone fragments were very tiny and these were  irrigated away gently. The calyces were re-inspected and there were no significant stone fragment residual.   We then withdrew the ureteroscope back down the ureter along with the access sheath, noting no evidence of any stones or trauma along the course of the ureter.  Prior to removing the ureteroscope, we did pass the Glidewire back up to the ureter to the renal pelvis.  Once the ureteroscope was removed, the Glidewire was backloaded through the rigid cystoscope, which was then advanced down the urethra and into the bladder. We then used the Glidewire under direct vision through the rigid cystoscope and under fluoroscopic guidance and passed up a 6-French, 26 cm double-pigtail ureteral stent up ureter, making sure that the proximal and distal ends coiled within the kidney and bladder respectively.  The patient tolerated the procedure well and there was no complication. Patient was awoken from anesthesia and taken to the recovery room in stable condition. I was present and scrubbed for the entirety of the case.  Plan:  Patient will be discharged home.  Follow up with me in 7 to 10 days for stent removal in the office.   Matt R. Zayaan Kozak MD [facility]  Pager: [phone]

## 2021-01-02 NOTE — H&P (Signed)
Office Visit Report     12/26/2020   --------------------------------------------------------------------------------   Ryan Stuart  MRN: [id number]  DOB: 1955-09-16, 65 year old Male  SSN: [id number]   PRIMARY CARE:    REFERRING:    PROVIDER:  Jettie Pagan, M.D.  LOCATION:  [facility] - [phone]     --------------------------------------------------------------------------------   CC/HPI: Ryan Stuart is a 65 year old male seen as a hospital follow-up with left UPJ stone.   He presented to the ED on 12/09/2020 where CT A/P demonstrated 11 mm stone at the left UPJ with mild left hydronephrosis. He was found to have an AKI with creatinine 3.4 from a baseline of 2. He underwent left ureteral stent placement on 12/09/2020. He is seen today for preoperative appointment for left ureteroscopy with laser lithotripsy.   He does have history of neurogenic bladder which has been recently diagnosed with the [place]. He has been undergoing clean intermittent catheterizations for the past several months under the direction of the [place] hospital. He is scheduled with them later this month for TRUS biopsy and cystoscopy.   Patient currently denies fever, chills, sweats, nausea, vomiting, abdominal or flank pain, gross hematuria or dysuria.     ALLERGIES: None   MEDICATIONS: Finasteride 5 mg tablet  Lisinopril 20 mg tablet  Tamsulosin Hcl 0.4 mg capsule  Atorvastatin Calcium 40 mg tablet  Carvedilol 25 mg tablet  Carvedilol  Chlorthalidone  Empagliflozin  Fish Oil  Tadalafil     GU PSH: None   NON-GU PSH: Cardiac Stent Placement Knee replacement, Right Remove Kidney Stone     GU PMH: None     PMH Notes: bladder failure unspecified   NON-GU PMH: Cardiac arrest, cause unspecified Hypercholesterolemia Hypertension Other chronic hepatitis, not elsewhere classified    FAMILY HISTORY: No Children - Other   SOCIAL HISTORY: Marital Status: Married Preferred Language:  English Current Smoking Status: Patient does not smoke anymore.   Tobacco Use Assessment Completed: Used Tobacco in last 30 days? Has never drank.  Drinks 3 caffeinated drinks per day. Patient's occupation is/was retired.    REVIEW OF SYSTEMS:    GU Review Male:   Patient reports burning/ pain with urination. Patient denies frequent urination, hard to postpone urination, get up at night to urinate, leakage of urine, stream starts and stops, trouble starting your stream, have to strain to urinate , erection problems, and penile pain.  Gastrointestinal (Upper):   Patient denies nausea, vomiting, and indigestion/ heartburn.  Gastrointestinal (Lower):   Patient reports diarrhea and constipation.   Constitutional:   Patient denies fever, night sweats, weight loss, and fatigue.  Skin:   Patient denies skin rash/ lesion and itching.  Eyes:   Patient denies blurred vision and double vision.  Ears/ Nose/ Throat:   Patient denies sore throat and sinus problems.  Hematologic/Lymphatic:   Patient denies swollen glands and easy bruising.  Cardiovascular:   Patient denies leg swelling and chest pains.  Respiratory:   Patient denies cough and shortness of breath.  Endocrine:   Patient denies excessive thirst.  Musculoskeletal:   Patient reports back pain. Patient denies joint pain.  Neurological:   Patient denies headaches and dizziness.  Psychologic:   Patient denies depression and anxiety.   VITAL SIGNS: None   MULTI-SYSTEM PHYSICAL EXAMINATION:    Constitutional: Well-nourished. No physical deformities. Normally developed. Good grooming.  Respiratory: No labored breathing, no use of accessory muscles.   Cardiovascular: Normal temperature, normal extremity pulses, no swelling, no varicosities.  Gastrointestinal:  No mass, no tenderness, no rigidity, non obese abdomen.     Complexity of Data:  Source Of History:  Patient, Medical Record Summary  Records Review:   AUA Symptom Score  Urine Test  Review:   Urinalysis  X-Ray Review: C.T. Abdomen/Pelvis: Reviewed Films. Reviewed Report. Discussed With Patient.     PROCEDURES:          Urinalysis w/Scope Dipstick Dipstick Cont'd Micro  Color: Yellow Bilirubin: Neg mg/dL WBC/hpf: 10 - 86/PYP  Appearance: Slightly Cloudy Ketones: Neg mg/dL RBC/hpf: 3 - 95/KDT  Specific Gravity: 1.020 Blood: 2+ ery/uL Bacteria: Rare (0-9/hpf)  pH: <=5.0 Protein: 2+ mg/dL Cystals: NS (Not Seen)  Glucose: Trace mg/dL Urobilinogen: 0.2 mg/dL Casts: NS (Not Seen)    Nitrites: Neg Trichomonas: Not Present    Leukocyte Esterase: Trace leu/uL Mucous: Not Present      Epithelial Cells: NS (Not Seen)      Yeast: NS (Not Seen)      Sperm: Not Present    ASSESSMENT:      ICD-10 Details  1 GU:   Renal calculus - N20.0   2   Bladder, Neuromuscular dysfunction, Unspec - N31.9    PLAN:           Orders Labs CULTURE, URINE          Document Letter(s):  Created for Patient: Clinical Summary         Notes:   #1. Left UPJ stone: CT A/P 12/08/2020 with 11 mm stone in the left UPJ. S/p left ureteral stent placement on 12/09/2020. He is scheduled for left ureteroscopy with laser lithotripsy with extraction of stone next week. We will send urine for culture today. Discussed risk and benefits.   2. Neurogenic bladder: He has been under going clean intermittent catheterizations under the guidance of the [facility] hospital and has further evaluation with him later this month for cystoscopy, TRUS ultrasound and potentially urodynamics. He would like to continue that work-up there.        Next Appointment:      Next Appointment: 01/02/2021 03:30 PM    Appointment Type: Surgery     Location: [facility] [phone]    Provider: Jettie Pagan, M.D.    Reason for Visit: [facility]/OP CYSTO LT RPG, LT URS , LL       Signed by Jettie Pagan, M.D. on 12/26/20 at 10:19 AM (EDT)  .Urology Preoperative H&P   Chief Complaint: Left renal stone  History of Present  Illness: Ryan Stuart is a 65 y.o. male with left renal stone here for cysto, L RPG, L URS/LL, L stent. Denies fevers, chills. Ucx 12/26/2020 NG.    Past Medical History:  Diagnosis Date   Arthritis    Coronary artery disease    Diabetes mellitus without complication (HCC)    History of kidney stones    Hypertension    Myocardial infarction (HCC) 08/22/2008   Renal disorder     Past Surgical History:  Procedure Laterality Date   CORONARY ANGIOPLASTY WITH STENT PLACEMENT     CYSTOSCOPY W/ URETERAL STENT PLACEMENT Left 12/09/2020   Procedure: CYSTOSCOPY WITH RETROGRADE PYELOGRAM/URETERAL STENT PLACEMENT;  Surgeon: Jannifer Hick, MD;  Location: [facility];  Service: Urology;  Laterality: Left;   LITHOTRIPSY     REPLACEMENT TOTAL KNEE Right 2018    Allergies: No Known Allergies  No family history on file.  Social History:  reports that he quit smoking about 17 years ago. His smoking use  included cigarettes. He has never used smokeless tobacco. He reports previous alcohol use. He reports that he does not use drugs.  ROS: A complete review of systems was performed.  All systems are negative except for pertinent findings as noted.  Physical Exam:  Vital signs in last 24 hours:   Constitutional:  Alert and oriented, No acute distress Cardiovascular: Regular rate and rhythm Respiratory: Normal respiratory effort, Lungs clear bilaterally GI: Abdomen is soft, nontender, nondistended, no abdominal masses GU: No CVA tenderness Lymphatic: No lymphadenopathy Neurologic: Grossly intact, no focal deficits Psychiatric: Normal mood and affect  Laboratory Data:  No results for input(s): WBC, HGB, HCT, PLT in the last 72 hours.  No results for input(s): NA, K, CL, GLUCOSE, BUN, CALCIUM, CREATININE in the last 72 hours.  Invalid input(s): CO3   No results found for this or any previous visit (from the past 24 hour(s)). No results found for this or any previous visit (from the past 240  hour(s)).  Renal Function: Recent Labs    12/29/20 0953  CREATININE 1.42*   Estimated Creatinine Clearance: 57.5 mL/min (A) (by C-G formula based on SCr of 1.42 mg/dL (H)).  Radiologic Imaging: No results found.  I independently reviewed the above imaging studies.  Assessment and Plan Ryan Stuart is a 65 y.o. male with left renal stone here for cysto, L RPG, L URS/LL, L stent. Denies fevers, chills. Ucx 12/26/2020 NG.  -The risks, benefits and alternatives of cystoscopy with L RPG, L URS/LL, L JJ stent placement was discussed with the patient.  Risks include, but are not limited to: bleeding, urinary tract infection, ureteral injury, ureteral stricture disease, chronic pain, urinary symptoms, bladder injury, stent migration, the need for nephrostomy tube placement, MI, CVA, DVT, PE and the inherent risks with general anesthesia.  The patient voices understanding and wishes to proceed.    Ryan R. Lendy Dittrich MD 01/02/2021, 11:11 AM  [facility] Pager: [phone]): [phone]

## 2021-01-03 ENCOUNTER — Encounter (HOSPITAL_COMMUNITY): Payer: Self-pay | Admitting: Urology

## 2021-01-03 NOTE — Anesthesia Postprocedure Evaluation (Signed)
Anesthesia Post Note  Patient: Ryan Stuart  Procedure(s) Performed: CYSTOSCOPY/RETROGRADE/URETEROSCOPY/HOLMIUM LASER/STENT PLACEMENT (Left)     Patient location during evaluation: PACU Anesthesia Type: General Level of consciousness: awake Pain management: pain level controlled Vital Signs Assessment: post-procedure vital signs reviewed and stable Respiratory status: spontaneous breathing, nonlabored ventilation, respiratory function stable and patient connected to nasal cannula oxygen Cardiovascular status: blood pressure returned to baseline and stable Postop Assessment: no apparent nausea or vomiting Anesthetic complications: no   No notable events documented.  Last Vitals:  Vitals:   01/02/21 1800 01/02/21 1817  BP: (!) 151/80 (!) 160/81  Pulse: (!) 51 (!) 55  Resp: 10 18  Temp:    SpO2: 98% 99%    Last Pain:  Vitals:   01/02/21 1817  TempSrc:   PainSc: 2                  Ghada Abbett P Shambria Camerer

## 2022-02-10 IMAGING — CT CT RENAL STONE PROTOCOL
2 of 4 series · 16 of 46 positions shown, 18 images · non-contrast
Comparison: 12/08/2020 at [DATE] p.m.

CLINICAL DATA: Weakness, dizziness, left renal calculus seen on CT
earlier today, urinary tract infection

EXAM:
CT ABDOMEN AND PELVIS WITHOUT CONTRAST
TECHNIQUE: Multidetector CT imaging of the abdomen and pelvis was performed
following the standard protocol without IV contrast.

[Series 2: axial st · axial · 0.79mm/px · z∈[+896,+1351]mm · 13 of 105 slices shown, 15 images]
[im 7/105  soft-tissue]
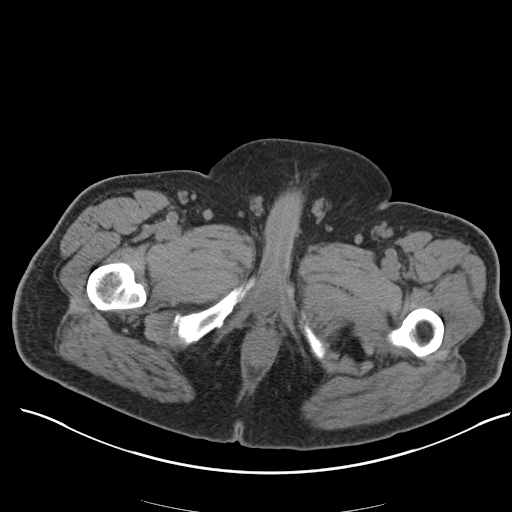
[im 7/105  bone]
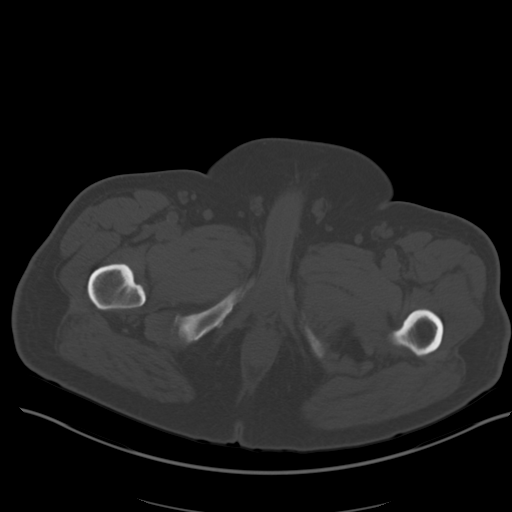
[im 13/105  soft-tissue]
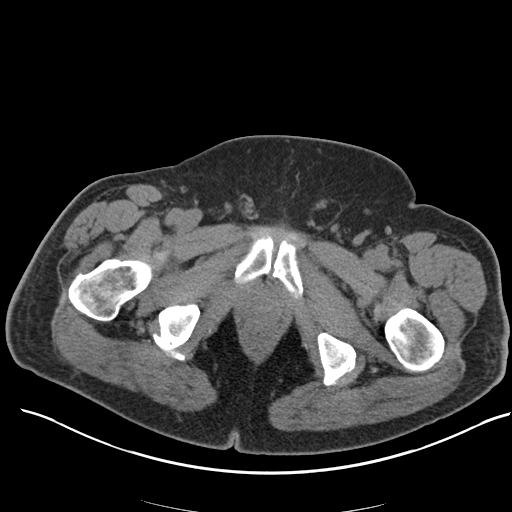
[im 25/105  soft-tissue]
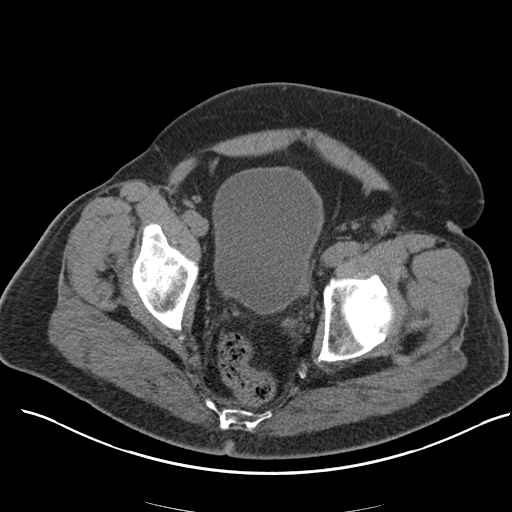
[im 31/105  soft-tissue]
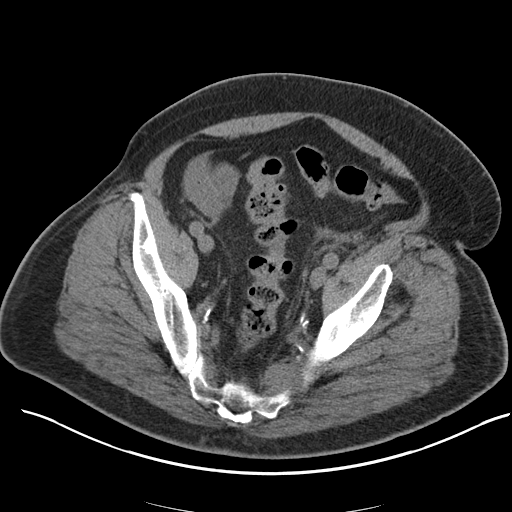
[im 37/105  soft-tissue]
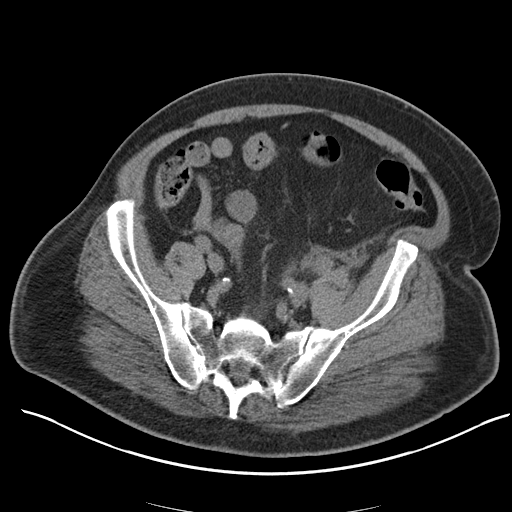
[im 43/105  soft-tissue]
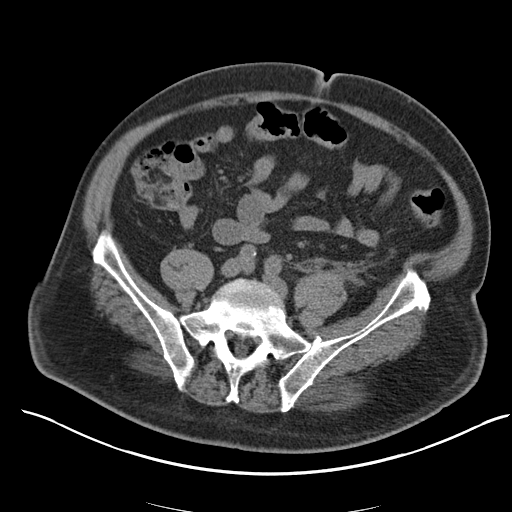
[im 56/105  soft-tissue]
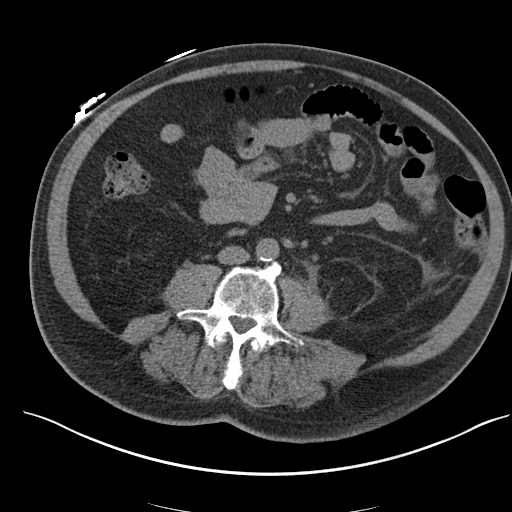
[im 62/105  soft-tissue]
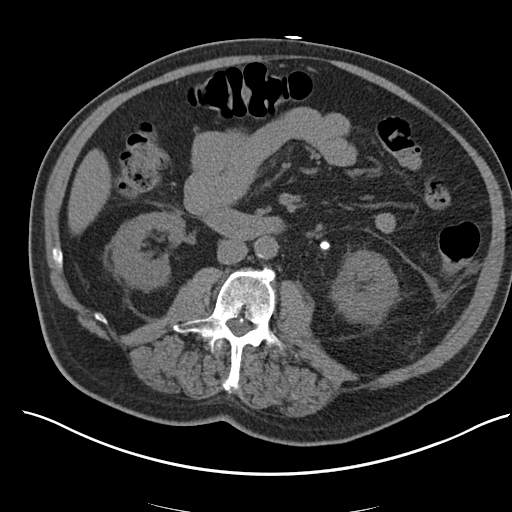
[im 68/105  soft-tissue]
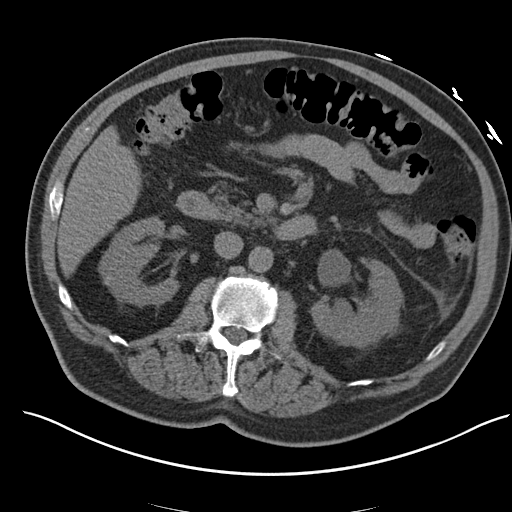
[im 68/105  bone]
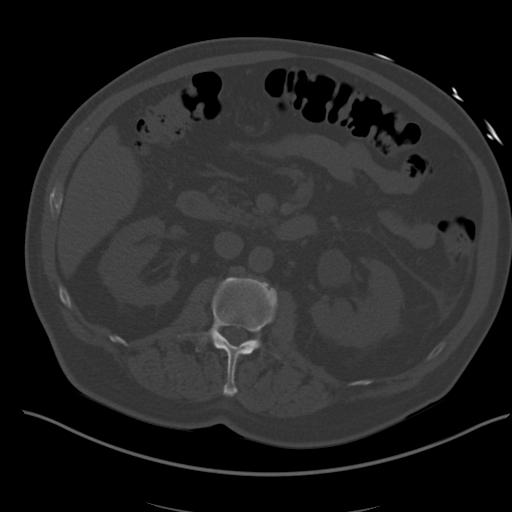
[im 74/105  soft-tissue]
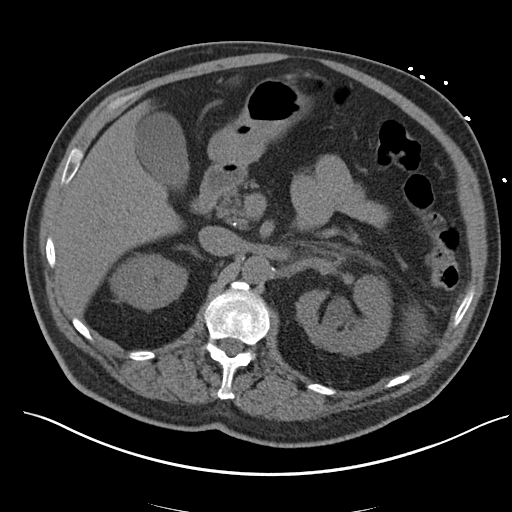
[im 80/105  soft-tissue]
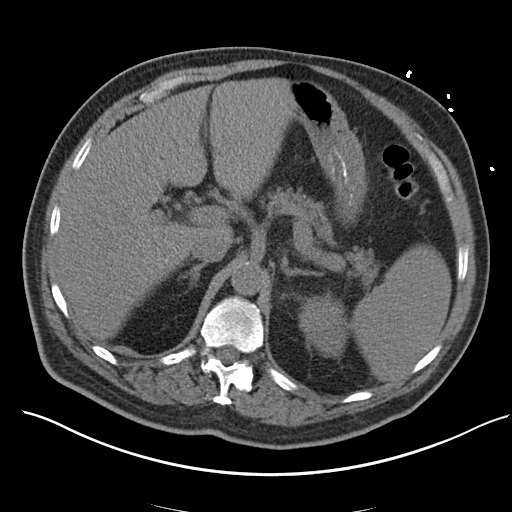
[im 92/105  soft-tissue]
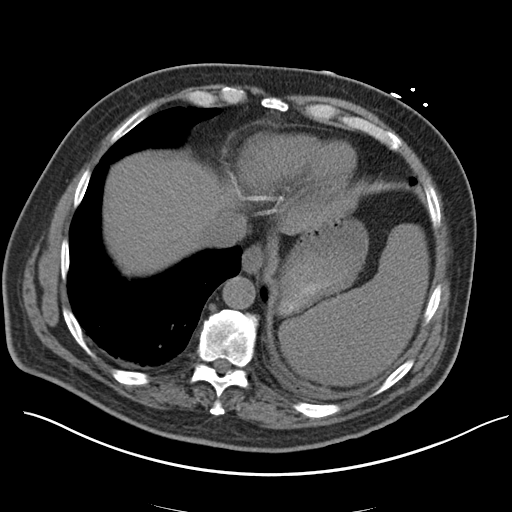
[im 98/105  soft-tissue]
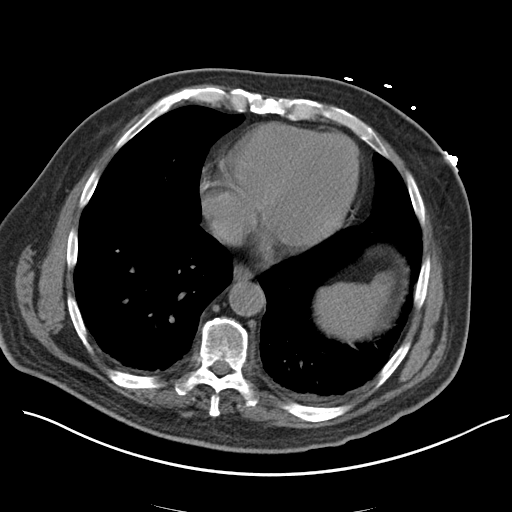

[Series 5: coronal · coronal · 0.75mm/px · 3 of 177 slices shown]
[im 59/177  soft-tissue]
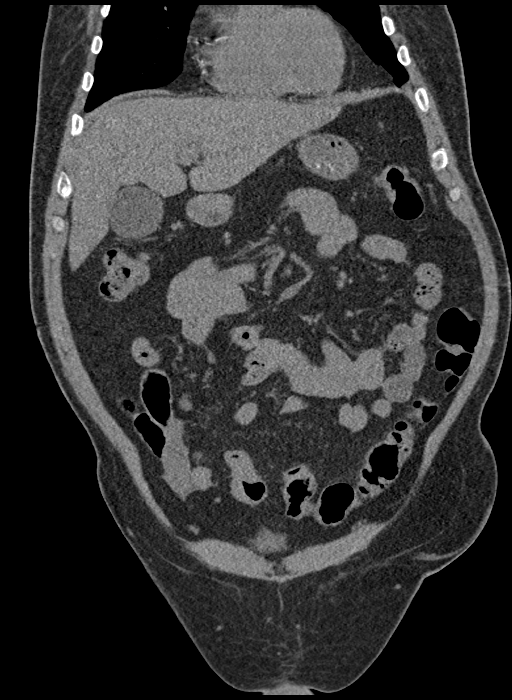
[im 79/177  soft-tissue]
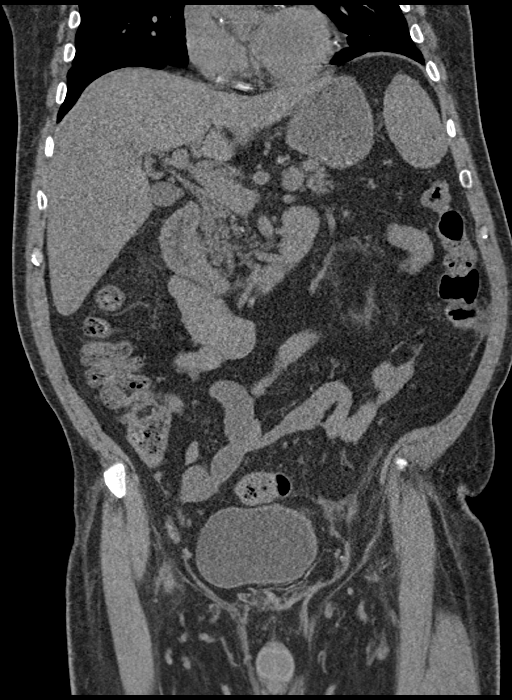
[im 98/177  soft-tissue]
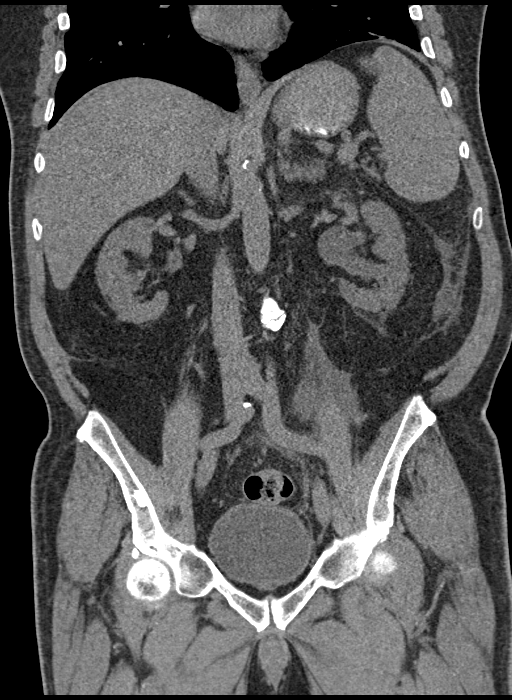

[16 of 46 positions shown; findings below may reference images not displayed]

FINDINGS: Lower chest: Trace left pleural effusion. No acute airspace disease.

Hepatobiliary: Stable unenhanced appearance of the liver and
gallbladder without focal abnormality.

Pancreas: Unremarkable. No pancreatic ductal dilatation or
surrounding inflammatory changes.

Spleen: Stable mild splenomegaly.

Adrenals/Urinary Tract: Stable 8 mm obstructing proximal left
ureteral calculus, with moderate left hydronephrosis. Punctate less
than 3 mm nonobstructing left renal calculi are also noted. Stable
fat stranding in the left perinephric region and extending
inferiorly in the left retroperitoneum.

Unenhanced imaging of the right kidney is unremarkable. The adrenals
and bladder are grossly normal.

Stomach/Bowel: No bowel obstruction or ileus. Normal appendix right
lower quadrant. No bowel wall thickening or inflammatory change.

Vascular/Lymphatic: Aortic atherosclerosis. No enlarged abdominal or
pelvic lymph nodes.

Reproductive: Prostate is unremarkable.

Other: No free intraperitoneal fluid or free gas. No abdominal wall
hernia.

Musculoskeletal: No acute or destructive bony lesions. Reconstructed
images demonstrate no additional findings.
IMPRESSION: 1. Obstructing 8 mm proximal left ureteral calculus, with stable
left-sided hydronephrosis.
2. Other punctate nonobstructing left renal calculi, stable.
3.  Aortic Atherosclerosis ([id number]).
4. Trace left pleural effusion.
